# Patient Record
Sex: Female | Born: 1975 | Race: Black or African American | Hispanic: No | Marital: Single | Smoking: Former smoker
Health system: Southern US, Community
[De-identification: ages and names within clinical notes are randomized; demographics above are authoritative.]

## PROBLEM LIST (undated history)

## (undated) DIAGNOSIS — D649 Anemia, unspecified: Secondary | ICD-10-CM

---

## 2004-05-17 ENCOUNTER — Emergency Department: Payer: Self-pay | Admitting: General Practice

## 2005-07-06 ENCOUNTER — Emergency Department: Payer: Self-pay | Admitting: Emergency Medicine

## 2005-07-07 ENCOUNTER — Ambulatory Visit: Payer: Self-pay | Admitting: Emergency Medicine

## 2007-05-05 ENCOUNTER — Emergency Department: Payer: Self-pay | Admitting: Emergency Medicine

## 2008-04-12 ENCOUNTER — Emergency Department: Payer: Self-pay | Admitting: Emergency Medicine

## 2008-12-23 ENCOUNTER — Emergency Department: Payer: Self-pay | Admitting: Internal Medicine

## 2012-02-12 ENCOUNTER — Emergency Department: Payer: Self-pay | Admitting: Emergency Medicine

## 2015-02-25 ENCOUNTER — Encounter: Payer: Self-pay | Admitting: Emergency Medicine

## 2015-02-25 ENCOUNTER — Emergency Department
Admission: EM | Admit: 2015-02-25 | Discharge: 2015-02-25 | Disposition: A | Discharge status: Home / Self Care | Payer: No Typology Code available for payment source | Attending: Emergency Medicine | Admitting: Emergency Medicine

## 2015-02-25 DIAGNOSIS — Y9389 Activity, other specified: Secondary | ICD-10-CM | POA: Insufficient documentation

## 2015-02-25 DIAGNOSIS — Y92410 Unspecified street and highway as the place of occurrence of the external cause: Secondary | ICD-10-CM | POA: Diagnosis not present

## 2015-02-25 DIAGNOSIS — Z880 Allergy status to penicillin: Secondary | ICD-10-CM | POA: Diagnosis not present

## 2015-02-25 DIAGNOSIS — S161XXA Strain of muscle, fascia and tendon at neck level, initial encounter: Secondary | ICD-10-CM | POA: Insufficient documentation

## 2015-02-25 DIAGNOSIS — Y998 Other external cause status: Secondary | ICD-10-CM | POA: Insufficient documentation

## 2015-02-25 DIAGNOSIS — Z87891 Personal history of nicotine dependence: Secondary | ICD-10-CM | POA: Diagnosis not present

## 2015-02-25 DIAGNOSIS — S29002A Unspecified injury of muscle and tendon of back wall of thorax, initial encounter: Secondary | ICD-10-CM | POA: Insufficient documentation

## 2015-02-25 DIAGNOSIS — S199XXA Unspecified injury of neck, initial encounter: Secondary | ICD-10-CM | POA: Diagnosis present

## 2015-02-25 MED ORDER — CYCLOBENZAPRINE HCL 5 MG PO TABS: 5.0000 mg | ORAL_TABLET | Freq: Three times a day (TID) | ORAL | Status: DC | PRN
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
## 2017-05-07 ENCOUNTER — Encounter: Payer: Self-pay | Admitting: Emergency Medicine

## 2017-05-07 ENCOUNTER — Emergency Department
Admission: EM | Admit: 2017-05-07 | Discharge: 2017-05-07 | Disposition: A | Discharge status: Home / Self Care | Payer: Medicaid Other | Attending: Emergency Medicine | Admitting: Emergency Medicine

## 2017-05-07 ENCOUNTER — Other Ambulatory Visit: Payer: Self-pay

## 2017-05-07 DIAGNOSIS — R05 Cough: Secondary | ICD-10-CM

## 2017-05-07 DIAGNOSIS — B349 Viral infection, unspecified: Secondary | ICD-10-CM | POA: Diagnosis not present

## 2017-05-07 DIAGNOSIS — R070 Pain in throat: Secondary | ICD-10-CM | POA: Diagnosis not present

## 2017-05-07 DIAGNOSIS — R0981 Nasal congestion: Secondary | ICD-10-CM | POA: Insufficient documentation

## 2017-05-07 DIAGNOSIS — Z87891 Personal history of nicotine dependence: Secondary | ICD-10-CM | POA: Insufficient documentation

## 2017-05-07 DIAGNOSIS — R059 Cough, unspecified: Secondary | ICD-10-CM

## 2017-05-07 LAB — INFLUENZA PANEL BY PCR (TYPE A & B)
INFLAPCR: NEGATIVE
Influenza B By PCR: NEGATIVE

## 2017-05-07 MED ORDER — HYDROCOD POLST-CPM POLST ER 10-8 MG/5ML PO SUER
5.0000 mL | Freq: Once | ORAL | Status: AC
  Administered 2017-05-07: 5 mL via ORAL
  Filled 2017-05-07: qty 5

## 2017-05-07 MED ORDER — HYDROCOD POLST-CPM POLST ER 10-8 MG/5ML PO SUER: 5.0000 mL | Freq: Two times a day (BID) | ORAL | 0 refills | Status: DC

## 2017-05-07 NOTE — ED Notes (Signed)
Patient reports symptoms for 2 weeks.  States cold and cough thought it had gotten better but the cough returned and was told by work to get checked out.

## 2017-05-07 NOTE — ED Triage Notes (Signed)
Pt arrives ambulatory to triage with c/o flu like symptoms. Pt reports having symptoms x 2 weeks. Pt is in NAD.

## 2017-05-07 NOTE — Discharge Instructions (Signed)
1.  You may take Tussionex as needed for cough. 2.  Return to the ER for worsening symptoms, persistent vomiting, difficulty breathing or other concerns.

## 2017-05-07 NOTE — ED Notes (Signed)
ED Provider at bedside. 

## 2017-05-07 NOTE — ED Provider Notes (Signed)
[facility] Emergency Department Provider Note   ____________________________________________   First MD Initiated Contact with Patient 05/07/17 0423     (approximate)  I have reviewed the triage vital signs and the nursing notes.   HISTORY  Chief Complaint Influenza    HPI Julie Tanner is a 42 y.o. female who presents to the ED from home with a chief complaint of cold-like symptoms.  Patient reports symptoms for the past 2 weeks.  Endorses dry cough, nasal congestion, sore throat.  Denies associated fever, chills, chest pain, abdominal pain, nausea, vomiting, diarrhea.  Denies recent travel or trauma.   Past medical history None  There are no active problems to display for this patient.   History reviewed. No pertinent surgical history.  Prior to Admission medications   Medication Sig Start Date End Date Taking? Authorizing Provider  chlorpheniramine-HYDROcodone (TUSSIONEX PENNKINETIC ER) 10-8 MG/5ML SUER Take 5 mLs by mouth 2 (two) times daily. 05/07/17   Paulette Blanch, MD  cyclobenzaprine (FLEXERIL) 5 MG tablet Take 1 tablet (5 mg total) by mouth 3 (three) times daily as needed for muscle spasms. 02/25/15   Menshew, Dannielle Karvonen, PA-C    Allergies Penicillins  No family history on file.  Social History Social History   Tobacco Use  . Smoking status: Former Research scientist (life sciences)  . Smokeless tobacco: Never Used  Substance Use Topics  . Alcohol use: No  . Drug use: No    Review of Systems  Constitutional: No fever/chills. Eyes: No visual changes. ENT: Positive for nasal congestion and sore throat. Cardiovascular: Denies chest pain. Respiratory: Positive for dry cough.  Denies shortness of breath. Gastrointestinal: No abdominal pain.  No nausea, no vomiting.  No diarrhea.  No constipation. Genitourinary: Negative for dysuria. Musculoskeletal: Negative for back pain. Skin: Negative for rash. Neurological: Negative for headaches, focal  weakness or numbness.   ____________________________________________   PHYSICAL EXAM:  VITAL SIGNS: ED Triage Vitals  Enc Vitals Group     BP 05/07/17 0047 134/86     Pulse Rate 05/07/17 0047 89     Resp 05/07/17 0047 19     Temp 05/07/17 0047 98.3 F (36.8 C)     Temp Source 05/07/17 0047 Oral     SpO2 05/07/17 0047 100 %     Weight 05/07/17 0047 220 lb (99.8 kg)     Height 05/07/17 0047 5\' 3"  (1.6 m)     Head Circumference --      Peak Flow --      Pain Score 05/07/17 0057 8     Pain Loc --      Pain Edu? --      Excl. in [place]? --     Constitutional: Alert and oriented. Well appearing and in no acute distress. Eyes: Conjunctivae are normal. PERRL. EOMI. Head: Atraumatic. Nose: Congestion/rhinnorhea. Mouth/Throat: Mucous membranes are moist.  Oropharynx slightly erythematous without tonsillar swelling, exudates or peritonsillar abscess.  Hoarse voice.  There is no muffled voice or drooling. Neck: No stridor.   Hematological/Lymphatic/Immunilogical: No cervical lymphadenopathy. Cardiovascular: Normal rate, regular rhythm. Grossly normal heart sounds.  Good peripheral circulation. Respiratory: Normal respiratory effort.  No retractions. Lungs CTAB. Gastrointestinal: Soft and nontender. No distention. No abdominal bruits. No CVA tenderness. Musculoskeletal: No lower extremity tenderness nor edema.  No joint effusions. Neurologic:  Normal speech and language. No gross focal neurologic deficits are appreciated. No gait instability. Skin:  Skin is warm, dry and intact. No rash noted.  No petechiae. Psychiatric:  Mood and affect are normal. Speech and behavior are normal.  ____________________________________________   LABS (all labs ordered are listed, but only abnormal results are displayed)  Labs Reviewed  INFLUENZA PANEL BY PCR (TYPE A & B)   ____________________________________________  EKG  None ____________________________________________  RADIOLOGY  ED MD  interpretation: None  Official radiology report(s): No results found.  ____________________________________________   PROCEDURES  Procedure(s) performed: None  Procedures  Critical Care performed: No  ____________________________________________   INITIAL IMPRESSION / ASSESSMENT AND PLAN / ED COURSE  As part of my medical decision making, I reviewed the following data within the [place] notes reviewed and incorporated, Labs reviewed and Notes from prior ED visits.   42 year old female who presents with viral bronchitis.  Will administer Tussionex.  Strict return precautions given.  Patient verbalize understanding and agrees with plan of care.      ____________________________________________   FINAL CLINICAL IMPRESSION(S) / ED DIAGNOSES  Final diagnoses:  Viral illness  Cough     ED Discharge Orders        Ordered    chlorpheniramine-HYDROcodone (TUSSIONEX PENNKINETIC ER) 10-8 MG/5ML SUER  2 times daily     05/07/17 0439       Note:  This document was prepared using Dragon voice recognition software and may include unintentional dictation errors.    Paulette Blanch, MD 05/07/17 [id number]

## 2018-05-24 ENCOUNTER — Emergency Department: Payer: Self-pay

## 2018-05-24 ENCOUNTER — Other Ambulatory Visit: Payer: Self-pay

## 2018-05-24 ENCOUNTER — Encounter: Payer: Self-pay | Admitting: Emergency Medicine

## 2018-05-24 ENCOUNTER — Emergency Department
Admission: EM | Admit: 2018-05-24 | Discharge: 2018-05-25 | Disposition: A | Discharge status: Home / Self Care | Payer: Self-pay | Attending: Emergency Medicine | Admitting: Emergency Medicine

## 2018-05-24 DIAGNOSIS — H5462 Unqualified visual loss, left eye, normal vision right eye: Secondary | ICD-10-CM

## 2018-05-24 DIAGNOSIS — Z79899 Other long term (current) drug therapy: Secondary | ICD-10-CM | POA: Insufficient documentation

## 2018-05-24 DIAGNOSIS — H53132 Sudden visual loss, left eye: Secondary | ICD-10-CM | POA: Insufficient documentation

## 2018-05-24 DIAGNOSIS — Z87891 Personal history of nicotine dependence: Secondary | ICD-10-CM | POA: Insufficient documentation

## 2018-05-24 LAB — COMPREHENSIVE METABOLIC PANEL
ALBUMIN: 3.6 g/dL (ref 3.5–5.0)
ALT: 10 U/L (ref 0–44)
AST: 13 U/L — AB (ref 15–41)
Alkaline Phosphatase: 53 U/L (ref 38–126)
Anion gap: 6 (ref 5–15)
BUN: 8 mg/dL (ref 6–20)
CHLORIDE: 106 mmol/L (ref 98–111)
CO2: 27 mmol/L (ref 22–32)
CREATININE: 0.55 mg/dL (ref 0.44–1.00)
Calcium: 8.4 mg/dL — ABNORMAL LOW (ref 8.9–10.3)
GFR calc non Af Amer: >60 mL/min (ref >60)
GLUCOSE: 106 mg/dL — AB (ref 70–99)
Potassium: 3.2 mmol/L — ABNORMAL LOW (ref 3.5–5.1)
SODIUM: 139 mmol/L (ref 135–145)
Total Bilirubin: 0.6 mg/dL (ref 0.3–1.2)
Total Protein: 7.5 g/dL (ref 6.5–8.1)

## 2018-05-24 LAB — CBC
HCT: 25.3 % — ABNORMAL LOW (ref 36.0–46.0)
HEMOGLOBIN: 6.7 g/dL — AB (ref 12.0–15.0)
MCH: 16.2 pg — AB (ref 26.0–34.0)
MCHC: 26.5 g/dL — ABNORMAL LOW (ref 30.0–36.0)
MCV: 61.3 fL — ABNORMAL LOW (ref 80.0–100.0)
NRBC: 0 % (ref 0.0–0.2)
PLATELETS: 558 10*3/uL — AB (ref 150–400)
RBC: 4.13 MIL/uL (ref 3.87–5.11)
RDW: 20.2 % — ABNORMAL HIGH (ref 11.5–15.5)
WBC: 6.5 10*3/uL (ref 4.0–10.5)

## 2018-05-24 LAB — POCT PREGNANCY, URINE: Preg Test, Ur: NEGATIVE

## 2018-05-24 MED ORDER — IOHEXOL 350 MG/ML SOLN
75.0000 mL | Freq: Once | INTRAVENOUS | Status: AC | PRN
  Administered 2018-05-24: 75 mL via INTRAVENOUS

## 2018-05-24 NOTE — ED Triage Notes (Signed)
Says she has lost vision in left eye 3 times today for few minutes each.  Last around 1050am.  Says the entire field of vision is gone when it happens.

## 2018-05-24 NOTE — Discharge Instructions (Signed)
Follow-up with Dr. George Ina at [facility] eye tomorrow.  You should call first thing in the morning and let them know that you were seen in the emergency department and were instructed to follow-up today.  Return to the ER immediately for new, worsening, or persistent vision changes, headache, weakness or numbness, or any other new or worsening symptoms that concern you.

## 2018-05-24 NOTE — ED Provider Notes (Signed)
[facility] Emergency Department Provider Note ____________________________________________   First MD Initiated Contact with Patient 05/24/18 1946     (approximate)  I have reviewed the triage vital signs and the nursing notes.   HISTORY  Chief Complaint Loss of Vision    HPI Julie Tanner is a 43 y.o. female with a history of anemia who presents with vision loss to her left eye, acute onset earlier this morning and occurring 3 times.  She states that the third time was the longest, lasting about 9 minutes, and ending just before 11 AM.  She states that when the vision came back it appeared snowy but this resolved after a short time and she has had no other vision disturbances.  She had no associated pain or any headache.  No prior history of symptoms like this.  She has no numbness or weakness in the rest of her body.  She denies chest pain, shortness of breath, or generalized weakness.  History reviewed. No pertinent past medical history.  There are no active problems to display for this patient.   History reviewed. No pertinent surgical history.  Prior to Admission medications   Medication Sig Start Date End Date Taking? Authorizing Provider  chlorpheniramine-HYDROcodone (TUSSIONEX PENNKINETIC ER) 10-8 MG/5ML SUER Take 5 mLs by mouth 2 (two) times daily. 05/07/17   Paulette Blanch, MD  cyclobenzaprine (FLEXERIL) 5 MG tablet Take 1 tablet (5 mg total) by mouth 3 (three) times daily as needed for muscle spasms. 02/25/15   Menshew, Dannielle Karvonen, PA-C    Allergies Penicillins  No family history on file.  Social History Social History   Tobacco Use  . Smoking status: Former Research scientist (life sciences)  . Smokeless tobacco: Never Used  Substance Use Topics  . Alcohol use: No  . Drug use: No    Review of Systems  Constitutional: No fever. Eyes: Positive for resolved vision loss. ENT: No sore throat. Cardiovascular: Denies chest pain. Respiratory: Denies  shortness of breath. Gastrointestinal: No vomiting or diarrhea.  Genitourinary: Negative for dysuria.  Musculoskeletal: Negative for back pain. Skin: Negative for rash. Neurological: Negative for headache.   ____________________________________________   PHYSICAL EXAM:  VITAL SIGNS: ED Triage Vitals  Enc Vitals Group     BP 05/24/18 1504 (!) 144/92     Pulse Rate 05/24/18 1504 86     Resp 05/24/18 1504 14     Temp 05/24/18 1504 98.4 F (36.9 C)     Temp Source 05/24/18 1504 Oral     SpO2 05/24/18 1504 100 %     Weight 05/24/18 1454 212 lb (96.2 kg)     Height 05/24/18 1454 5\' 3"  (1.6 m)     Head Circumference --      Peak Flow --      Pain Score 05/24/18 1454 6     Pain Loc --      Pain Edu? --      Excl. in [place]? --     Constitutional: Alert and oriented.  Relatively well appearing and in no acute distress. Eyes: Conjunctivae are normal.  EOMI.  PERRLA. Head: Atraumatic. Nose: No congestion/rhinnorhea. Mouth/Throat: Mucous membranes are moist.   Neck: Normal range of motion.  Cardiovascular: Good peripheral circulation. Respiratory: Normal respiratory effort.  No retractions.  Gastrointestinal: No distention.  Musculoskeletal:  Extremities warm and well perfused.  Neurologic:  Normal speech and language.  Cranial nerves III through XII intact.  Motor and sensory intact in all extremities.  Normal coordination  with no ataxia on finger-to-nose.  No gross focal neurologic deficits are appreciated.  Skin:  Skin is warm and dry. No rash noted. Psychiatric: Mood and affect are normal. Speech and behavior are normal.  ____________________________________________   LABS (all labs ordered are listed, but only abnormal results are displayed)  Labs Reviewed  CBC - Abnormal; Notable for the following components:      Result Value   Hemoglobin 6.7 (*)    HCT 25.3 (*)    MCV 61.3 (*)    MCH 16.2 (*)    MCHC 26.5 (*)    RDW 20.2 (*)    Platelets 558 (*)    All other  components within normal limits  COMPREHENSIVE METABOLIC PANEL - Abnormal; Notable for the following components:   Potassium 3.2 (*)    Glucose, Bld 106 (*)    Calcium 8.4 (*)    AST 13 (*)    All other components within normal limits   ____________________________________________  EKG   ____________________________________________  RADIOLOGY  CT head: No acute abnormalities MR brain: Pending  ____________________________________________   PROCEDURES  Procedure(s) performed: No  Procedures  Critical Care performed: No ____________________________________________   INITIAL IMPRESSION / ASSESSMENT AND PLAN / ED COURSE  Pertinent labs & imaging results that were available during my care of the patient were reviewed by me and considered in my medical decision making (see chart for details).  43 year old female with history of anemia presents with 3 episodes of vision loss in her left eye, described as the vision going completely black and resolving after a few minutes.  She has no prior history of this and has no associated symptoms.  On exam currently, the patient is well-appearing and her vital signs are normal except for hypertension.  Neurologic exam is nonfocal.  She denies any acute vision deficits at this time.  Overall the presentation is most consistent with amaurosis fugax versus some type of atypical migraine.  CT head obtained from triage is negative.  I will obtain an MRI and discuss with ophthalmology.  ----------------------------------------- 8:47 PM on 05/24/2018 -----------------------------------------  I discussed the case with Dr. George Ina from ophthalmology over the phone.  He agreed with the current plan of care and stated that the patient could follow-up with him tomorrow.  She is pending an MRI at this time.  Lab work-up reveals relatively significant anemia, with a hemoglobin of 6.7.  I was able to review past labs in care everywhere and the  patient has a history of chronic anemia running as low as the fives.  The current reading is actually close to her baseline.  She has no symptoms of anemia at this time.  Therefore, there is no indication for emergent transfusion or work-up.  If the MRI is negative, we will plan for discharge home with ophthalmology follow-up tomorrow.  The patient agrees with this plan.  I am signing the patient out to the oncoming physician Dr. Alfred Levins. ____________________________________________   FINAL CLINICAL IMPRESSION(S) / ED DIAGNOSES  Final diagnoses:  Vision loss of left eye      NEW MEDICATIONS STARTED DURING THIS VISIT:  New Prescriptions   No medications on file     Note:  This document was prepared using Dragon voice recognition software and may include unintentional dictation errors.    Arta Silence, MD 05/24/18 2049

## 2018-05-24 NOTE — ED Notes (Signed)
Sent green and purple tubes to lab. 

## 2018-05-24 NOTE — ED Notes (Signed)
Patient transported to CT 

## 2018-05-24 NOTE — ED Notes (Addendum)
Pt denies having lost vision since waiting in the lobby. Last episode of left sided vision loss lasted 9 minutes and pt reports when her vision started to return it came back partially first and then completely. Pt denies left sided weakness or deficits Pt was around daughter and coworkers who denied obvious changes in pts face. Nausea throughout episodes without vomiting. Pt denies hx of HTN but reports when BP was checked at work it was elevated. NO lightheadedness or dizziness and pt denies ever feeling as though she was going to pass out. Pt is calm, alert and oriented in treatment bed at this time. No neuro deficits noted. NO vision changes at this time. PERRLA

## 2018-05-24 NOTE — ED Notes (Signed)
MD at bedside. 

## 2018-05-25 LAB — PREGNANCY, URINE: Preg Test, Ur: NEGATIVE

## 2018-05-25 NOTE — ED Provider Notes (Signed)
I assumed care of the patient from Dr. Rae Lips at 11:30 PM with recommendation to follow-up on CT angiogram of the neck and if negative patient cleared to discharge home with follow-up with Dr. George Ina today.  CT angiogram revealed no acute abnormality.  Spoke with the patient who has no visual disturbances at present.  Patient be discharged home as recommended.   Gregor Hams, MD 05/25/18 [phone]

## 2019-01-17 ENCOUNTER — Other Ambulatory Visit: Payer: Self-pay

## 2019-01-17 DIAGNOSIS — Z20822 Contact with and (suspected) exposure to covid-19: Secondary | ICD-10-CM

## 2019-01-18 LAB — NOVEL CORONAVIRUS, NAA: SARS-CoV-2, NAA: NOT DETECTED

## 2019-02-15 ENCOUNTER — Other Ambulatory Visit: Payer: Self-pay

## 2019-02-15 DIAGNOSIS — Z20822 Contact with and (suspected) exposure to covid-19: Secondary | ICD-10-CM

## 2019-02-18 LAB — NOVEL CORONAVIRUS, NAA: SARS-CoV-2, NAA: NOT DETECTED

## 2020-04-20 IMAGING — MR MRI HEAD WITHOUT CONTRAST
11 series · 42 of 48 positions shown · non-contrast
Comparison: Prior CT earlier the same day.

CLINICAL DATA: Initial evaluation for amaurosis fugax, intermittent
left visual loss.

EXAM:
MRI HEAD WITHOUT CONTRAST
TECHNIQUE: Multiplanar, multiecho pulse sequences of the brain and surrounding
structures were obtained without intravenous contrast.

[Series 5: ax dwi_tracew · axial · 3.0mm · 0.60mm/px · z∈[-59,+103]mm · 4 of 55 slices shown]
[im 1/55]
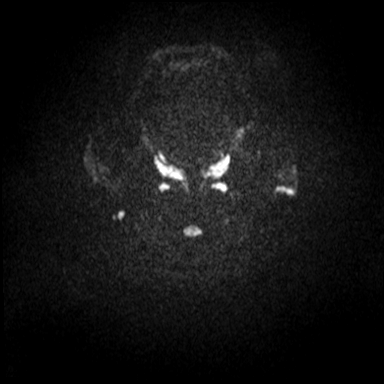
[im 19/55]
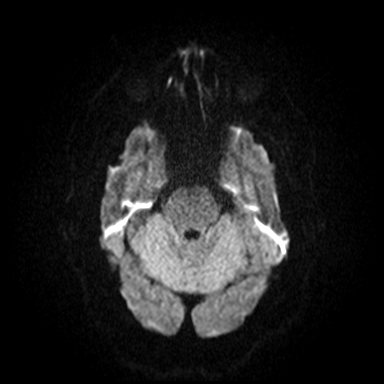
[im 37/55]
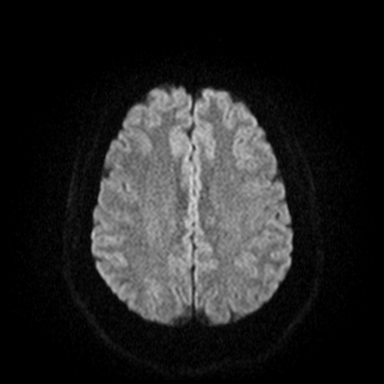
[im 55/55]
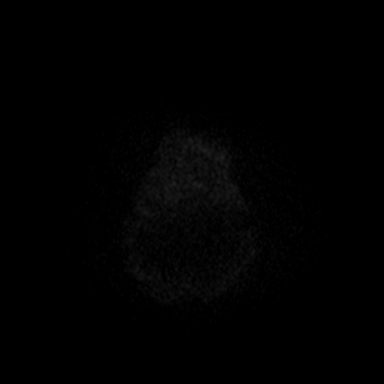

[Series 6: ax dwi_adc · axial · 3.0mm · 0.60mm/px · z∈[-59,+103]mm · 4 of 55 slices shown]
[im 1/55]
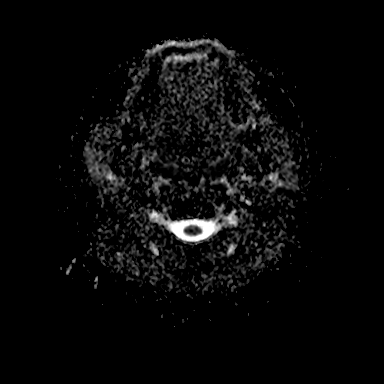
[im 19/55]
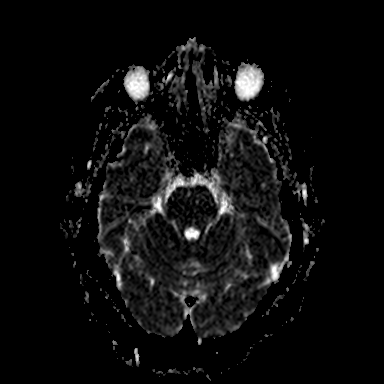
[im 37/55]
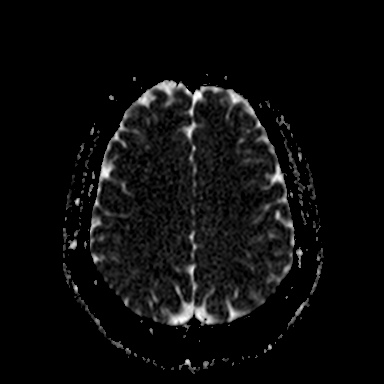
[im 55/55]
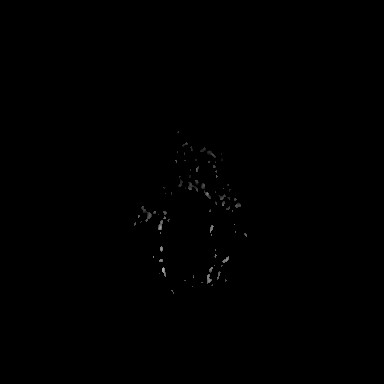

[Series 7: cor dwi_tracew · coronal · 5.0mm · 0.60mm/px · 3 of 39 slices shown]
[im 1/39]
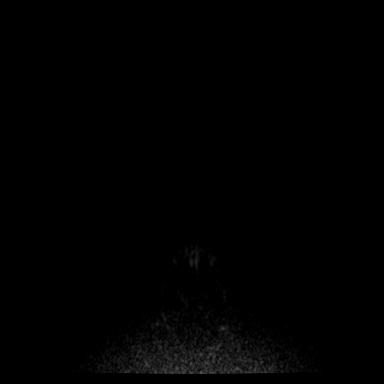
[im 20/39]
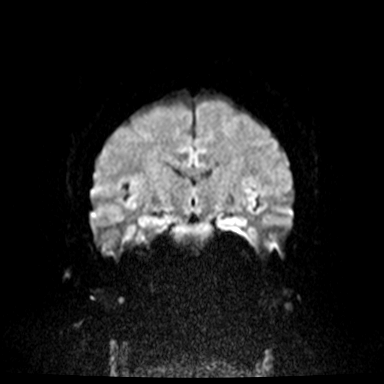
[im 39/39]
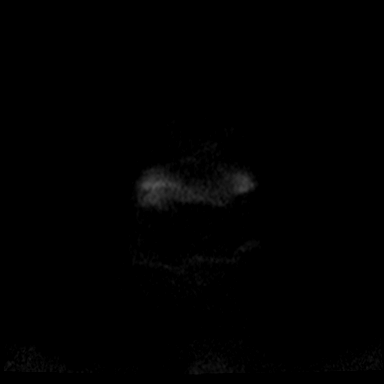

[Series 8: cor dwi_adc · coronal · 5.0mm · 0.60mm/px · 3 of 39 slices shown]
[im 1/39]
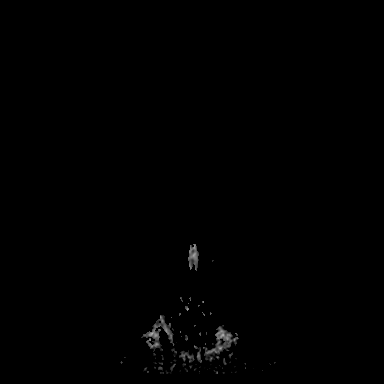
[im 20/39]
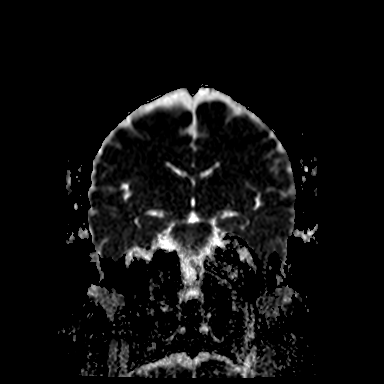
[im 39/39]
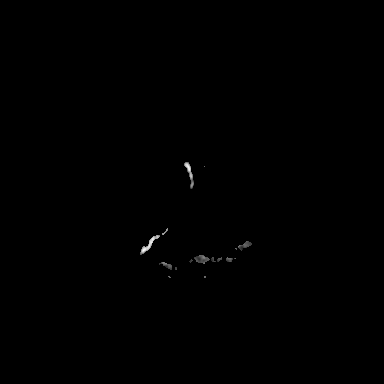

[Series 9: T1 · sagittal · 5.0mm · 0.62mm/px · 2 of 23 slices shown (1 of 2)]
[im 1/23]
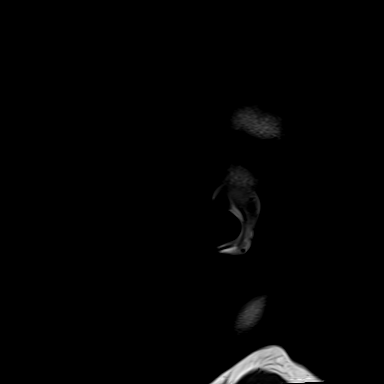
[im 23/23]
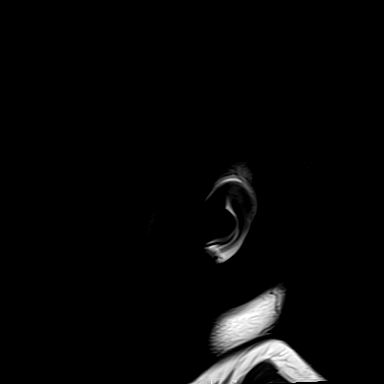

[Series 10: T2 · axial · 5.0mm · 0.53mm/px · z∈[-55,+101]mm · 2 of 27 slices shown (1 of 2)]
[im 1/27]
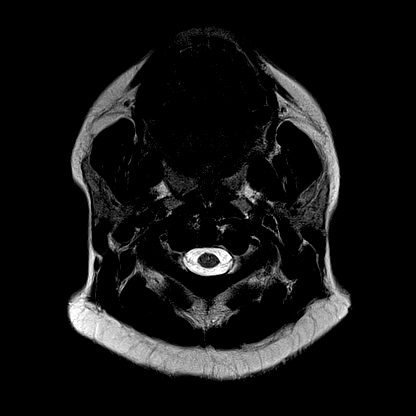
[im 27/27]
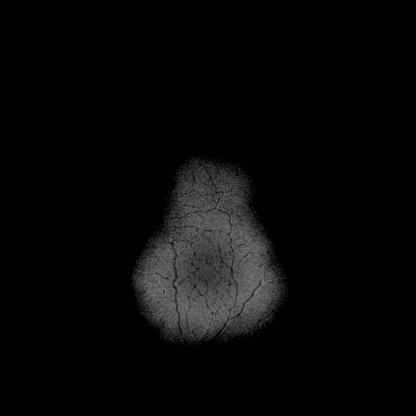

[Series 12: pha_images · axial · 3.0mm · 0.90mm/px · z∈[-66,+111]mm · 5 of 60 slices shown]
[im 1/60]
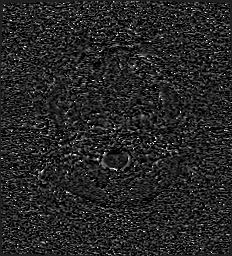
[im 15/60]
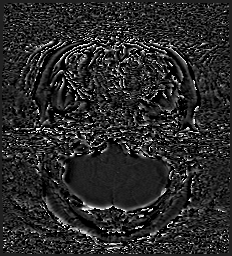
[im 30/60]
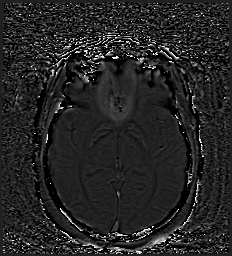
[im 45/60]
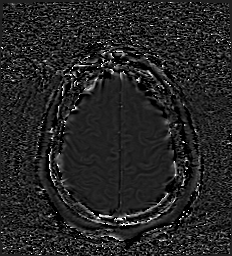
[im 60/60]
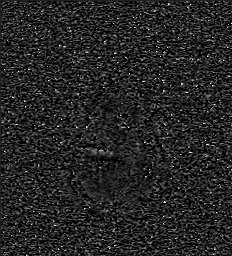

[Series 13: swi_images · axial · 3.0mm · 0.90mm/px · z∈[-66,+111]mm · 5 of 60 slices shown]
[im 1/60]
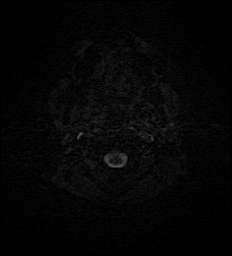
[im 15/60]
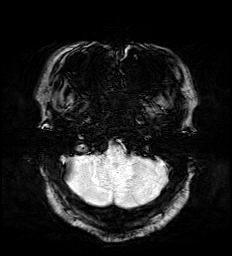
[im 30/60]
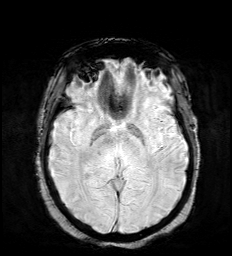
[im 45/60]
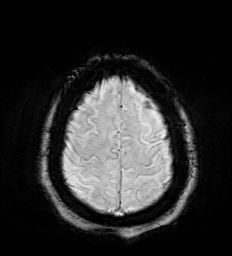
[im 60/60]
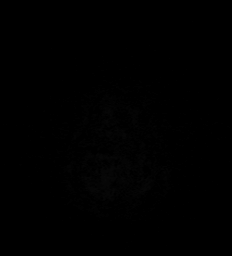

[Series 15: FLAIR · axial · 3.0mm · 0.53mm/px · z∈[-58,+104]mm · 4 of 55 slices shown]
[im 1/55]
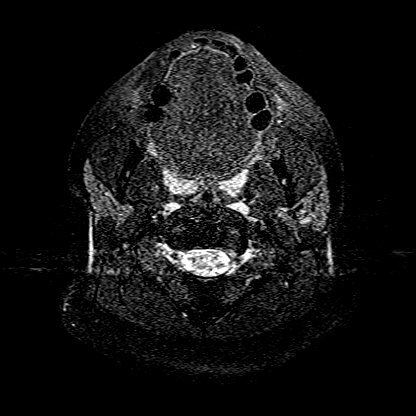
[im 19/55]
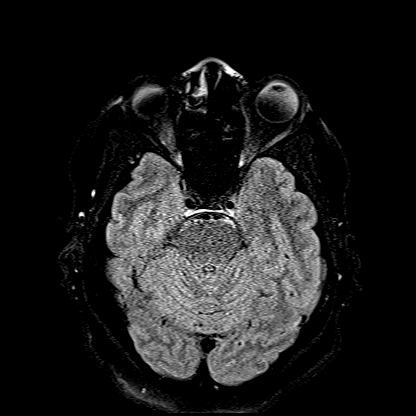
[im 37/55]
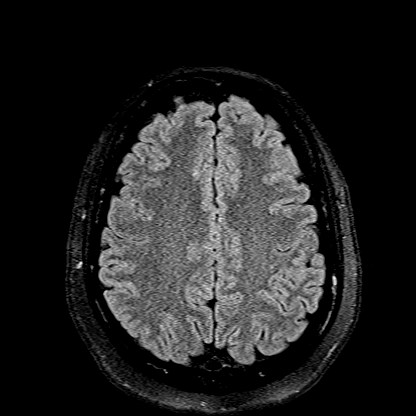
[im 55/55]
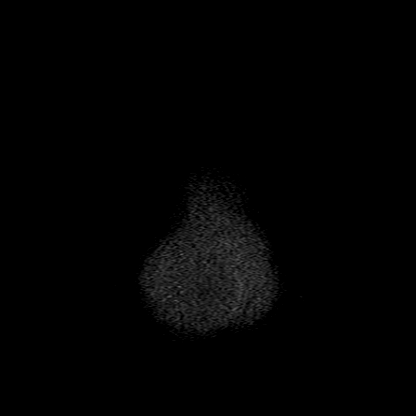

[Series 16: T1 · axial · 1.0mm · 0.98mm/px · z∈[-58,+117]mm · 8 of 176 slices shown (2 of 2)]
[im 1/176]
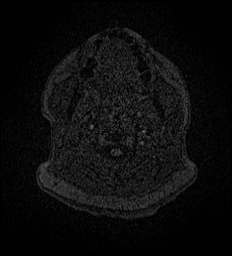
[im 27/176]
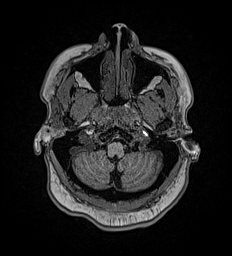
[im 54/176]
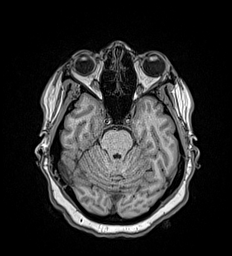
[im 81/176]
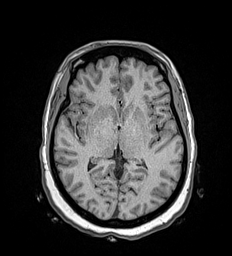
[im 95/176]
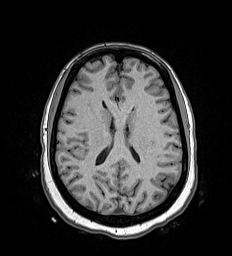
[im 122/176]
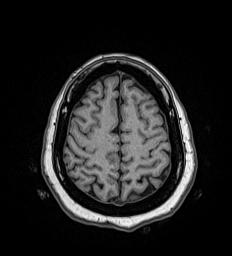
[im 149/176]
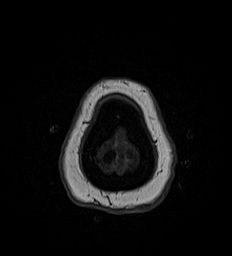
[im 176/176]
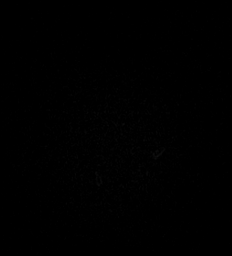

[Series 17: T2 · coronal · 5.0mm · 0.57mm/px · 2 of 29 slices shown (2 of 2)]
[im 1/29]
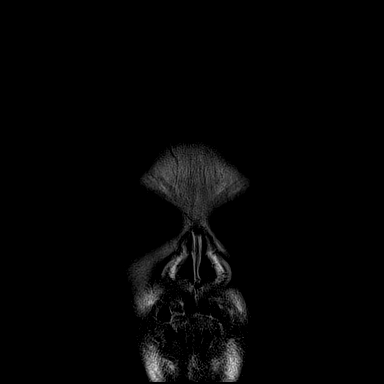
[im 29/29]
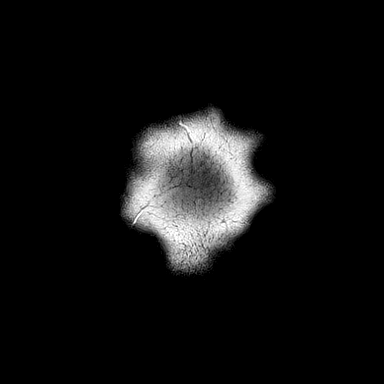

[42 of 48 positions shown; findings below may reference images not displayed]

FINDINGS: Brain: Cerebral volume within normal limits for patient age. No
focal parenchymal signal abnormality identified.

No abnormal foci of restricted diffusion to suggest acute or
subacute ischemia. Gray-white matter differentiation well
maintained. No encephalomalacia to suggest chronic infarction. No
foci of susceptibility artifact to suggest acute or chronic
intracranial hemorrhage.

No mass lesion, midline shift or mass effect. No hydrocephalus. No
extra-axial fluid collection. Major dural sinuses are grossly
patent.

Pituitary gland and suprasellar region are normal. Midline
structures intact and normal.

Vascular: Major intracranial vascular flow voids well maintained and
normal in appearance.

Skull and upper cervical spine: Craniocervical junction normal.
Visualized upper cervical spine within normal limits. Bone marrow
signal diffusely decreased on T1 weighted imaging, most likely
related to history of anemia. No focal marrow replacing lesion. No
scalp soft tissue abnormality.

Sinuses/Orbits: Globes and orbital soft tissues within normal
limits.

Paranasal sinuses are clear. No mastoid effusion. Inner ear
structures normal.

Other: None.
IMPRESSION: Negative brain MRI.  No acute intracranial abnormality.

## 2020-04-20 IMAGING — CT CT HEAD WITHOUT CONTRAST
3 series · 16 of 47 positions shown, 19 images · non-contrast
Comparison: February 12, 2012

CLINICAL DATA: Left-sided vision loss

EXAM:
CT HEAD WITHOUT CONTRAST
TECHNIQUE: Contiguous axial images were obtained from the base of the skull
through the vertex without intravenous contrast.

[Series 2: head wo · axial · 0.42mm/px · z∈[-115,+10]mm · 10 of 30 slices shown, 13 images]
[im 3/30  brain]
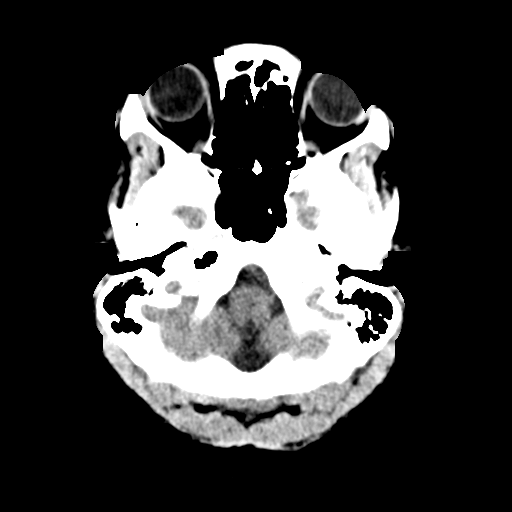
[im 3/30  bone]
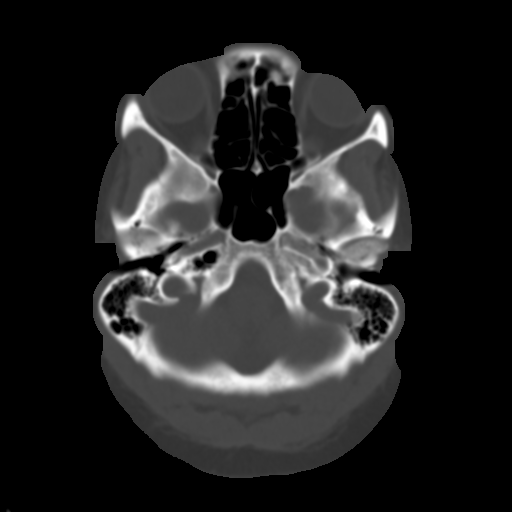
[im 6/30  brain]
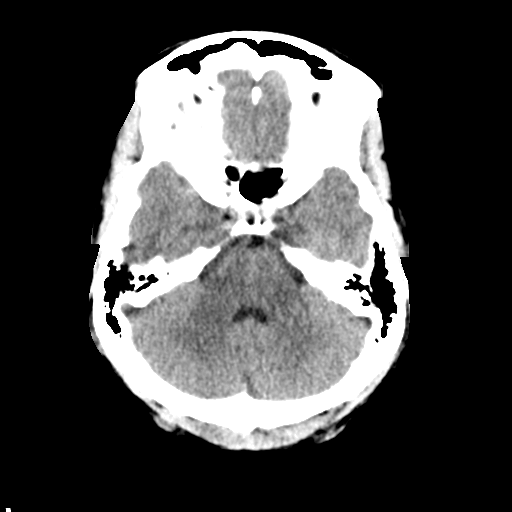
[im 9/30  brain]
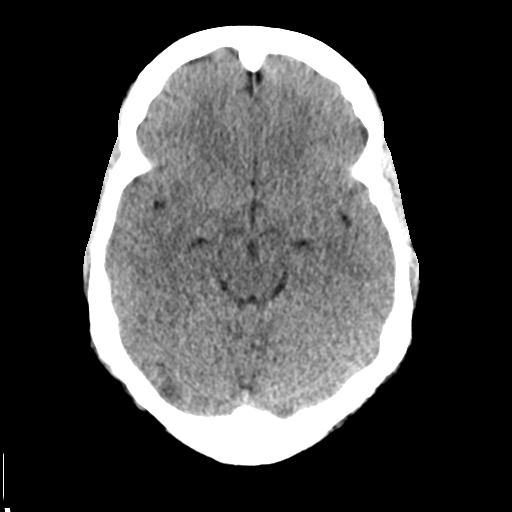
[im 11/30  brain]
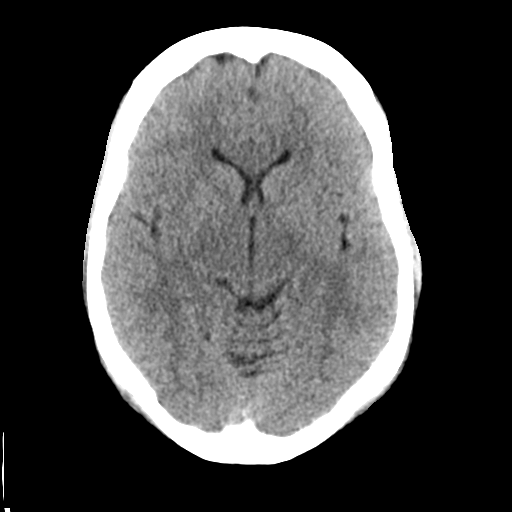
[im 14/30  brain]
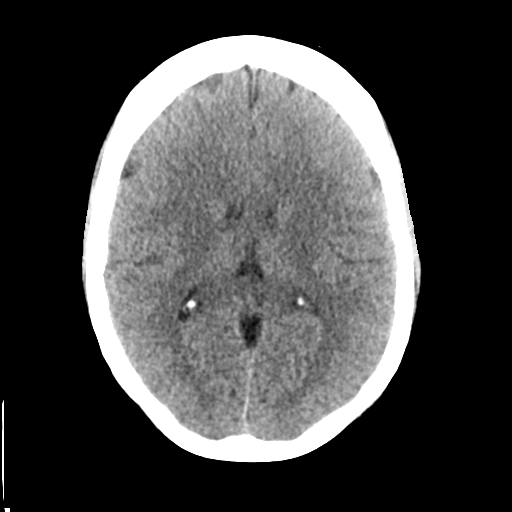
[im 14/30  bone]
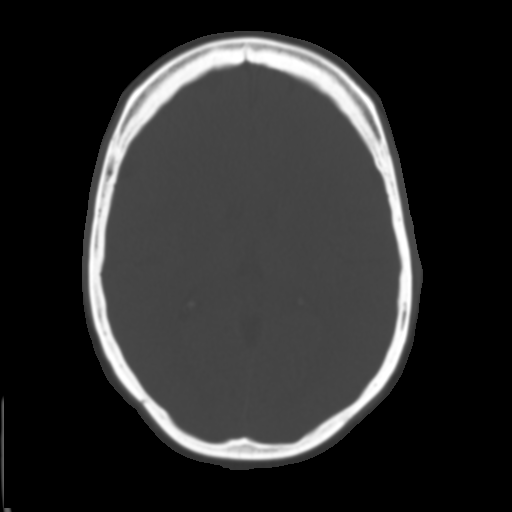
[im 17/30  brain]
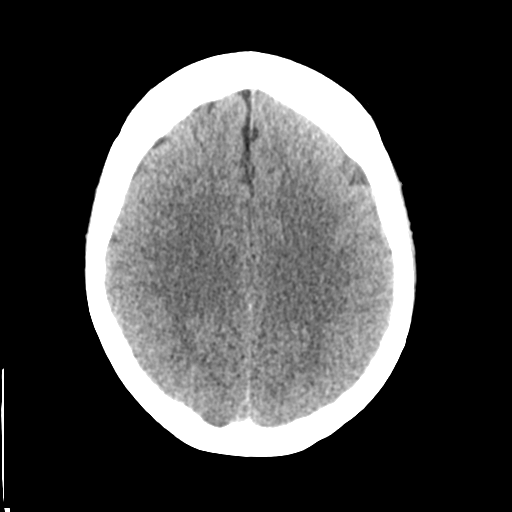
[im 20/30  brain]
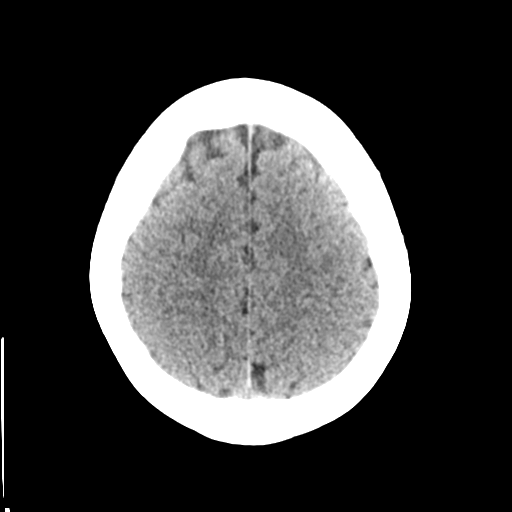
[im 23/30  brain]
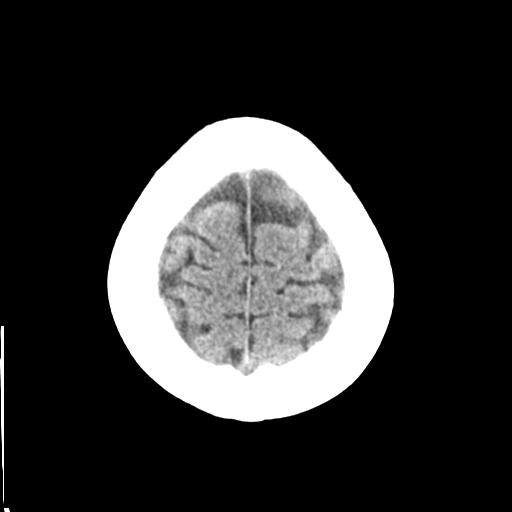
[im 25/30  brain]
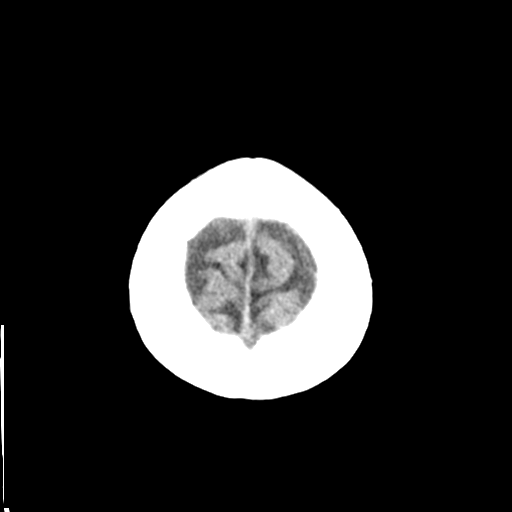
[im 25/30  bone]
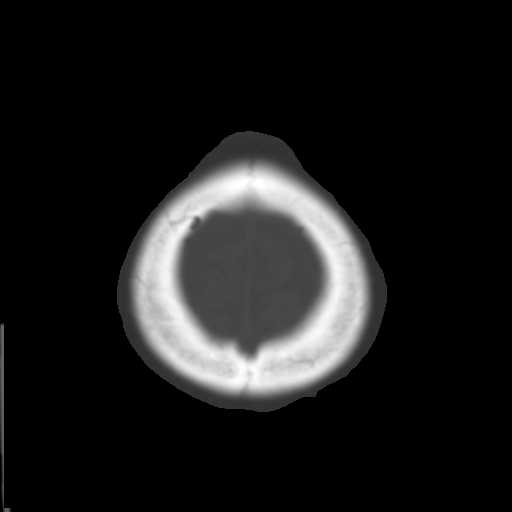
[im 28/30  brain]
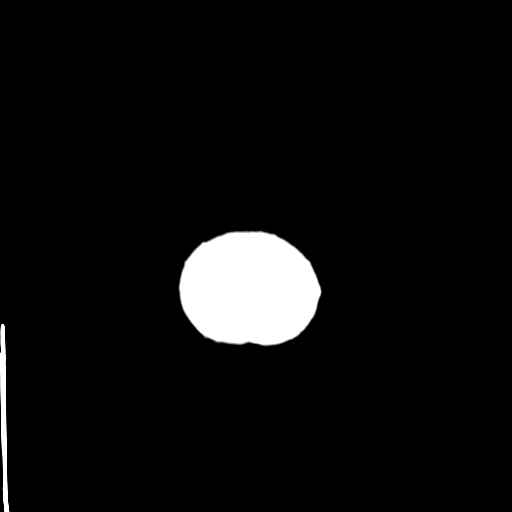

[Series 4: coronal soft tissue · coronal · 0.31mm/px · 3 of 62 slices shown]
[im 21/62  brain]
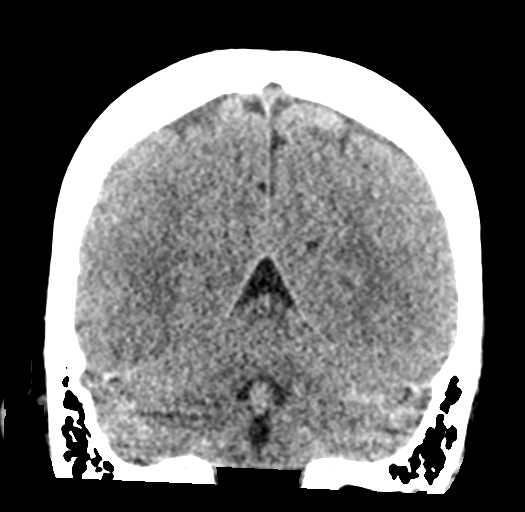
[im 28/62  brain]
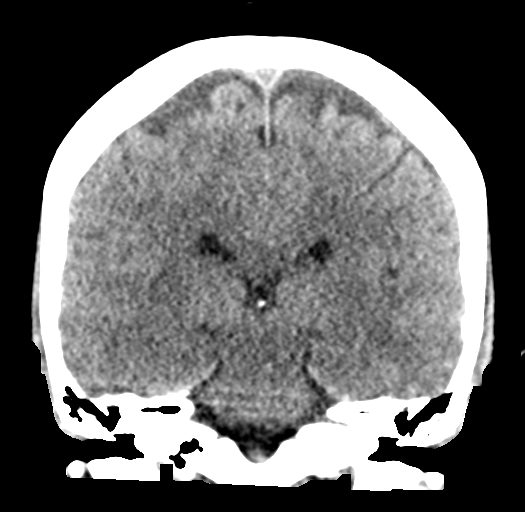
[im 34/62  brain]
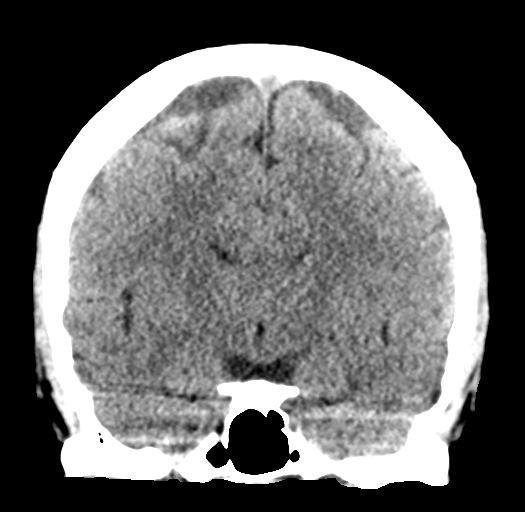

[Series 5: sagittal soft tissue · sagittal · 0.31mm/px · 3 of 50 slices shown]
[im 17/50  brain]
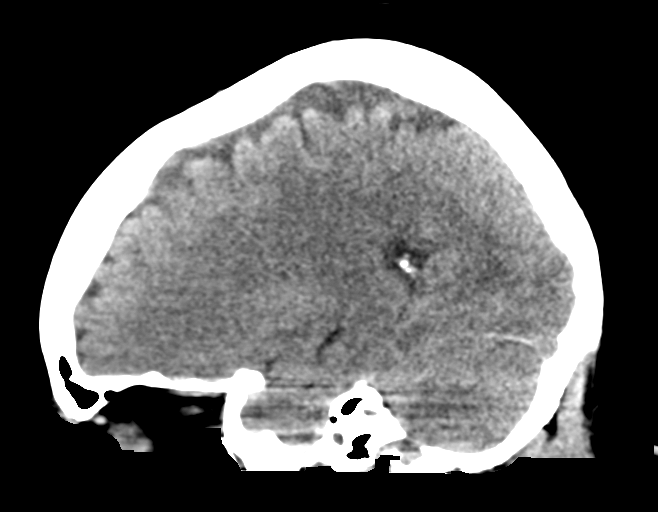
[im 25/50  brain]
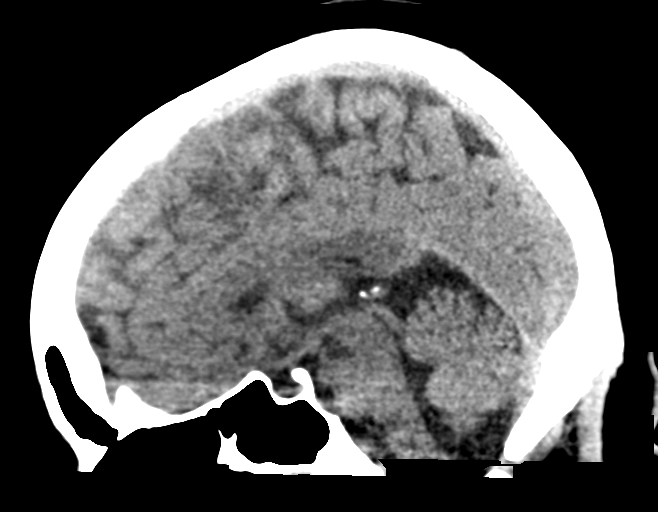
[im 33/50  brain]
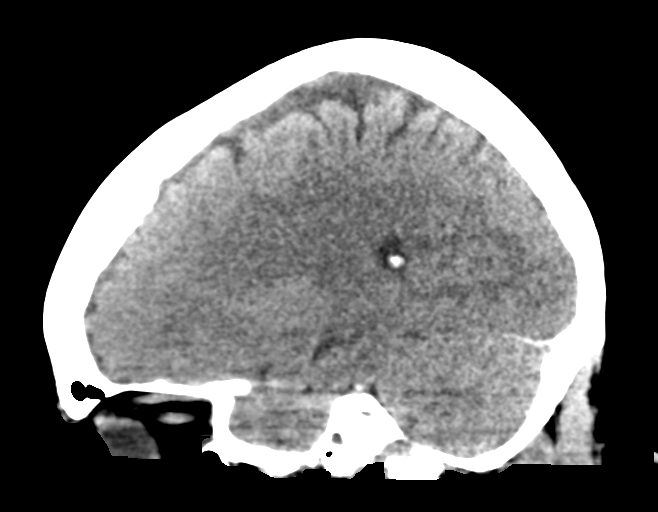

[16 of 47 positions shown; findings below may reference images not displayed]

FINDINGS: Brain: The ventricles are normal in size and configuration. There is
no intracranial mass, hemorrhage, extra-axial fluid collection, or
midline shift. The brain parenchyma appears unremarkable. There is
no evident acute infarct.

Vascular: No hyperdense vessel.  No vascular calcification evident.

Skull: The bony calvarium appears intact.

Sinuses/Orbits: Visualized paranasal sinuses are clear. Visualized
orbits appear symmetric bilaterally.

Other: Visualized mastoid air cells are clear.
IMPRESSION: Study within normal limits.

## 2020-07-21 ENCOUNTER — Other Ambulatory Visit: Payer: Self-pay

## 2020-07-21 ENCOUNTER — Emergency Department
Admission: EM | Admit: 2020-07-21 | Discharge: 2020-07-21 | Disposition: A | Discharge status: Home / Self Care | Payer: Self-pay | Attending: Emergency Medicine | Admitting: Emergency Medicine

## 2020-07-21 DIAGNOSIS — J029 Acute pharyngitis, unspecified: Secondary | ICD-10-CM | POA: Insufficient documentation

## 2020-07-21 DIAGNOSIS — J069 Acute upper respiratory infection, unspecified: Secondary | ICD-10-CM | POA: Insufficient documentation

## 2020-07-21 DIAGNOSIS — Z87891 Personal history of nicotine dependence: Secondary | ICD-10-CM | POA: Insufficient documentation

## 2020-07-21 DIAGNOSIS — H9201 Otalgia, right ear: Secondary | ICD-10-CM | POA: Insufficient documentation

## 2020-07-21 DIAGNOSIS — Z20822 Contact with and (suspected) exposure to covid-19: Secondary | ICD-10-CM | POA: Insufficient documentation

## 2020-07-21 LAB — GROUP A STREP BY PCR: Group A Strep by PCR: NOT DETECTED

## 2020-07-21 MED ORDER — LIDOCAINE VISCOUS HCL 2 % MT SOLN
15.0000 mL | Freq: Once | OROMUCOSAL | Status: AC
  Administered 2020-07-21: 15 mL via OROMUCOSAL
  Filled 2020-07-21: qty 15

## 2020-07-21 MED ORDER — LIDOCAINE VISCOUS HCL 2 % MT SOLN: 15.0000 mL | OROMUCOSAL | 0 refills | Status: DC | PRN

## 2020-07-21 NOTE — ED Triage Notes (Signed)
Pt arrives via pov with c/o rt ear pain, sore throat and nasal congestion starting last Thursday. Denies any fevers, or n/v/d. NAD noted at this time

## 2020-07-21 NOTE — Discharge Instructions (Addendum)
Please use Tylenol and ibuprofen as needed for pain.  You may also gargle and swallow with the viscous lidocaine that was sent to your pharmacy.  I will call you if your strep results are positive in which case I will also call in an antibiotic for you.  Follow-up with your primary care or return to the ER for any worsening.

## 2020-07-21 NOTE — ED Notes (Signed)
See triage note  Presents with right ear pain and sore throat since last weds  Denies any fever or cough

## 2020-07-21 NOTE — ED Provider Notes (Signed)
[facility] Emergency Department Provider Note  ____________________________________________   Event Date/Time   First MD Initiated Contact with Patient 07/21/20 1837     (approximate)  I have reviewed the triage vital signs and the nursing notes.   HISTORY  Chief Complaint Sore Throat, Otalgia, and Nasal Congestion   HPI Julie Tanner is a 45 y.o. female who presents to the emergency department for sore throat, ear pain and nasal congestion.  Patient reports sore throat started this past Thursday, 5 days ago.  She states she has been using over-the-counter medications with minimal relief.  Reports the associated ear pain in the right ear began 1 to 2 days after the sore throat, and noticed nasal congestion today.  She reports a very mild cough yesterday, denies any now.  Denies any recent fevers.  States that her granddaughter was recently tested positive for influenza.  She denies any abdominal pain, nausea vomiting or diarrhea.       History reviewed. No pertinent past medical history.  There are no problems to display for this patient.   History reviewed. No pertinent surgical history.  Prior to Admission medications   Medication Sig Start Date End Date Taking? Authorizing Provider  lidocaine (XYLOCAINE) 2 % solution Use as directed 15 mLs in the mouth or throat as needed for mouth pain. 07/21/20  Yes Marlana Salvage, PA    Allergies Penicillins  History reviewed. No pertinent family history.  Social History Social History   Tobacco Use  . Smoking status: Former Research scientist (life sciences)  . Smokeless tobacco: Never Used  Substance Use Topics  . Alcohol use: No  . Drug use: No    Review of Systems Constitutional: No fever/chills Eyes: No visual changes. ENT: + sore throat, + right ear pain,+ nasal congestion Cardiovascular: Denies chest pain. Respiratory: Denies shortness of breath. Gastrointestinal: No abdominal pain.  No nausea, no vomiting.   No diarrhea.  No constipation. Genitourinary: Negative for dysuria. Musculoskeletal: Negative for back pain. Skin: Negative for rash. Neurological: Negative for headaches, focal weakness or numbness.   ____________________________________________   PHYSICAL EXAM:  VITAL SIGNS: ED Triage Vitals  Enc Vitals Group     BP 07/21/20 1841 (!) 161/86     Pulse Rate 07/21/20 1841 95     Resp 07/21/20 1841 17     Temp 07/21/20 1841 98.4 F (36.9 C)     Temp Source 07/21/20 1841 Oral     SpO2 07/21/20 1841 100 %     Weight 07/21/20 1842 214 lb (97.1 kg)     Height 07/21/20 1842 5\' 3"  (1.6 m)     Head Circumference --      Peak Flow --      Pain Score 07/21/20 1842 10     Pain Loc --      Pain Edu? --      Excl. in [place]? --    Constitutional: Alert and oriented. Well appearing and in no acute distress. Eyes: Conjunctivae are normal. PERRL. EOMI. Head: Atraumatic. Ears: The bilateral TMs are visualized.  The right TM does have clear fluid level line posterior, but no bulging or erythema.  Left TM is normal. Nose: Mild congestion/rhinnorhea. Mouth/Throat: Mucous membranes are moist.  Oropharynx with cobblestoning but no gross erythema, no tonsillar enlargement or exudate. Neck: No stridor.   Cardiovascular: Normal rate, regular rhythm. Grossly normal heart sounds.  Good peripheral circulation. Respiratory: Normal respiratory effort.  No retractions. Lungs CTAB. Gastrointestinal: Soft and nontender. No distention.  No abdominal bruits. No CVA tenderness. Musculoskeletal: No lower extremity tenderness nor edema.  No joint effusions. Neurologic:  Normal speech and language. No gross focal neurologic deficits are appreciated. No gait instability. Skin:  Skin is warm, dry and intact. No rash noted. Psychiatric: Mood and affect are normal. Speech and behavior are normal.  ____________________________________________   LABS (all labs ordered are listed, but only abnormal results are  displayed)  Labs Reviewed  GROUP A STREP BY PCR  SARS CORONAVIRUS 2 (TAT 6-24 HRS)    ____________________________________________   INITIAL IMPRESSION / ASSESSMENT AND PLAN / ED COURSE  As part of my medical decision making, I reviewed the following data within the [facility] notes reviewed and incorporated, Labs reviewed and Notes from prior ED visits        Patient is a 45 year old female who presents to the emergency department for evaluation of sore throat, ear pain and nasal congestion beginning this past Thursday with recent ill contact of her granddaughter tested positive for influenza.  She denies any fevers, chest pain, shortness of breath, abdominal pain, nausea vomiting or diarrhea.  In triage, patient is afebrile with mild hypertension but otherwise normal vital signs.  Physical exam as above.  Findings most consistent with viral URI with pharyngitis.  Did offer the patient strep and COVID testing, will notify her if she is positive for either.  Deferred flu testing given that she is outside of the Tamiflu window for benefit.  In the interim, discussed Flonase for nasal congestion and this will likely also help her sore throat if this is from postnasal drip.  Also provided viscous lidocaine for topical use for her throat pain.  Return precautions were discussed, patient amenable with plan and stable time for outpatient follow-up.      ____________________________________________   FINAL CLINICAL IMPRESSION(S) / ED DIAGNOSES  Final diagnoses:  Viral URI  Pharyngitis, unspecified etiology     ED Discharge Orders         Ordered    lidocaine (XYLOCAINE) 2 % solution  As needed        07/21/20 1920          *Please note:  Julie Tanner was evaluated in Emergency Department on 07/21/2020 for the symptoms described in the history of present illness. She was evaluated in the context of the global COVID-19 pandemic, which necessitated  consideration that the patient might be at risk for infection with the SARS-CoV-2 virus that causes COVID-19. Institutional protocols and algorithms that pertain to the evaluation of patients at risk for COVID-19 are in a state of rapid change based on information released by regulatory bodies including the CDC and federal and state organizations. These policies and algorithms were followed during the patient's care in the ED.  Some ED evaluations and interventions may be delayed as a result of limited staffing during and the pandemic.*   Note:  This document was prepared using Dragon voice recognition software and may include unintentional dictation errors.   Marlana Salvage, PA 07/21/20 2105    Blake Divine, MD 07/22/20 [phone]

## 2020-07-22 LAB — SARS CORONAVIRUS 2 (TAT 6-24 HRS): SARS Coronavirus 2: NEGATIVE

## 2022-03-04 ENCOUNTER — Other Ambulatory Visit: Payer: Self-pay

## 2022-03-04 ENCOUNTER — Emergency Department: Payer: Self-pay

## 2022-03-04 ENCOUNTER — Emergency Department
Admission: EM | Admit: 2022-03-04 | Discharge: 2022-03-04 | Disposition: A | Discharge status: Home / Self Care | Payer: Self-pay | Attending: Emergency Medicine | Admitting: Emergency Medicine

## 2022-03-04 DIAGNOSIS — D509 Iron deficiency anemia, unspecified: Secondary | ICD-10-CM | POA: Insufficient documentation

## 2022-03-04 DIAGNOSIS — D649 Anemia, unspecified: Secondary | ICD-10-CM

## 2022-03-04 DIAGNOSIS — J101 Influenza due to other identified influenza virus with other respiratory manifestations: Secondary | ICD-10-CM | POA: Insufficient documentation

## 2022-03-04 DIAGNOSIS — D219 Benign neoplasm of connective and other soft tissue, unspecified: Secondary | ICD-10-CM

## 2022-03-04 DIAGNOSIS — Z1152 Encounter for screening for COVID-19: Secondary | ICD-10-CM | POA: Insufficient documentation

## 2022-03-04 HISTORY — DX: Anemia, unspecified: D64.9

## 2022-03-04 LAB — CBC WITH DIFFERENTIAL/PLATELET
Abs Immature Granulocytes: 0.02 10*3/uL (ref 0.00–0.07)
Abs Immature Granulocytes: 0.03 10*3/uL (ref 0.00–0.07)
Basophils Absolute: 0 10*3/uL (ref 0.0–0.1)
Basophils Absolute: 0 10*3/uL (ref 0.0–0.1)
Basophils Relative: 0 %
Basophils Relative: 0 %
Eosinophils Absolute: 0.1 10*3/uL (ref 0.0–0.5)
Eosinophils Absolute: 0.1 10*3/uL (ref 0.0–0.5)
Eosinophils Relative: 1 %
Eosinophils Relative: 1 %
HCT: 16.4 % — ABNORMAL LOW (ref 36.0–46.0)
HCT: 25.3 % — ABNORMAL LOW (ref 36.0–46.0)
Hemoglobin: 3.9 g/dL — CL (ref 12.0–15.0)
Hemoglobin: 7.6 g/dL — ABNORMAL LOW (ref 12.0–15.0)
Immature Granulocytes: 0 %
Immature Granulocytes: 0 %
Lymphocytes Relative: 31 %
Lymphocytes Relative: 27 %
Lymphs Abs: 1.7 10*3/uL (ref 0.7–4.0)
Lymphs Abs: 2 10*3/uL (ref 0.7–4.0)
MCH: 13.8 pg — ABNORMAL LOW (ref 26.0–34.0)
MCH: 20.4 pg — ABNORMAL LOW (ref 26.0–34.0)
MCHC: 23.8 g/dL — ABNORMAL LOW (ref 30.0–36.0)
MCHC: 30 g/dL (ref 30.0–36.0)
MCV: 58 fL — ABNORMAL LOW (ref 80.0–100.0)
MCV: 67.8 fL — ABNORMAL LOW (ref 80.0–100.0)
Monocytes Absolute: 0.6 10*3/uL (ref 0.1–1.0)
Monocytes Absolute: 0.6 10*3/uL (ref 0.1–1.0)
Monocytes Relative: 11 %
Monocytes Relative: 8 %
Neutro Abs: 3 10*3/uL (ref 1.7–7.7)
Neutro Abs: 4.7 10*3/uL (ref 1.7–7.7)
Neutrophils Relative %: 57 %
Neutrophils Relative %: 64 %
Platelets: 520 10*3/uL — ABNORMAL HIGH (ref 150–400)
Platelets: 449 10*3/uL — ABNORMAL HIGH (ref 150–400)
RBC: 2.83 MIL/uL — ABNORMAL LOW (ref 3.87–5.11)
RBC: 3.73 MIL/uL — ABNORMAL LOW (ref 3.87–5.11)
RDW: 21.1 % — ABNORMAL HIGH (ref 11.5–15.5)
Smear Review: NORMAL
WBC: 5.4 10*3/uL (ref 4.0–10.5)
WBC: 7.5 10*3/uL (ref 4.0–10.5)
nRBC: 0.7 % — ABNORMAL HIGH (ref 0.0–0.2)
nRBC: 0.3 % — ABNORMAL HIGH (ref 0.0–0.2)

## 2022-03-04 LAB — BASIC METABOLIC PANEL
Anion gap: 8 (ref 5–15)
BUN: 9 mg/dL (ref 6–20)
CO2: 24 mmol/L (ref 22–32)
Calcium: 8.4 mg/dL — ABNORMAL LOW (ref 8.9–10.3)
Chloride: 108 mmol/L (ref 98–111)
Creatinine, Ser: 0.62 mg/dL (ref 0.44–1.00)
GFR, Estimated: >60 mL/min (ref >60)
Glucose, Bld: 98 mg/dL (ref 70–99)
Potassium: 3.1 mmol/L — ABNORMAL LOW (ref 3.5–5.1)
Sodium: 140 mmol/L (ref 135–145)

## 2022-03-04 LAB — ABO/RH: ABO/RH(D): B POS

## 2022-03-04 LAB — RESP PANEL BY RT-PCR (RSV, FLU A&B, COVID)  RVPGX2
Influenza A by PCR: POSITIVE — AB
Influenza B by PCR: NEGATIVE
Resp Syncytial Virus by PCR: NEGATIVE
SARS Coronavirus 2 by RT PCR: NEGATIVE

## 2022-03-04 LAB — TROPONIN I (HIGH SENSITIVITY)
Troponin I (High Sensitivity): 9 ng/L (ref <18)
Troponin I (High Sensitivity): 10 ng/L (ref <18)

## 2022-03-04 LAB — HCG, QUANTITATIVE, PREGNANCY: hCG, Beta Chain, Quant, S: <1 m[IU]/mL (ref <5)

## 2022-03-04 LAB — PREPARE RBC (CROSSMATCH)

## 2022-03-04 MED ORDER — NORGESTIMATE-ETH ESTRADIOL 0.25-35 MG-MCG PO TABS: 1.0000 | ORAL_TABLET | Freq: Every day | ORAL | 11 refills | Status: DC

## 2022-03-04 MED ORDER — SODIUM CHLORIDE 0.9 % IV SOLN
10.0000 mL/h | Freq: Once | INTRAVENOUS | Status: AC
  Administered 2022-03-04: 10 mL/h via INTRAVENOUS

## 2022-03-04 MED ORDER — CALCIUM GLUCONATE-NACL 1-0.675 GM/50ML-% IV SOLN
1.0000 g | Freq: Once | INTRAVENOUS | Status: AC
  Administered 2022-03-04: 1000 mg via INTRAVENOUS
  Filled 2022-03-04: qty 50

## 2022-03-04 MED ORDER — POTASSIUM CHLORIDE CRYS ER 20 MEQ PO TBCR
20.0000 meq | EXTENDED_RELEASE_TABLET | Freq: Once | ORAL | Status: AC
  Administered 2022-03-04: 20 meq via ORAL
  Filled 2022-03-04: qty 1

## 2022-03-04 NOTE — ED Notes (Signed)
Pt ambulated independently to restroom

## 2022-03-04 NOTE — ED Notes (Signed)
Pt signed written consent for blood transfusion.

## 2022-03-04 NOTE — ED Notes (Signed)
Second unit of blood complete.

## 2022-03-04 NOTE — ED Notes (Signed)
Charge RN made aware of critical lab and needing bed

## 2022-03-04 NOTE — ED Notes (Signed)
Pt arrived with complaint of weakness and fatigue. Pt states she has hx of fibroids. Pt has been seen for iron infusions and was told she would possibly need a hysterectomy. Pt endorses SOB when ambulating. Pt denies CP, N/V/D, fevers. Pt reports she has been on her period for a month now and right now it is light.   Pt alert and oriented x4. Pt has 2+ bilateral radial pulses. Pt denies concerns or needs at this time.

## 2022-03-04 NOTE — ED Notes (Signed)
1st unit of blood complete

## 2022-03-04 NOTE — ED Triage Notes (Addendum)
Patient complains of dizziness shortness of breath.  Reports legs have been aching since Wednesday.  Patient has hx of anemia and mucous membranes are white and conjuctivsa are white. Patient reports menstural cycle has been on for a month

## 2022-03-04 NOTE — ED Notes (Signed)
RN made aware of pt in room and EDT placing pt on monitor

## 2022-03-04 NOTE — ED Notes (Signed)
Patient transported to Ultrasound 

## 2022-03-04 NOTE — ED Provider Notes (Signed)
[facility] Provider Note    None    (approximate)   History   Dizziness   HPI  Julie Tanner is a 46 y.o. female   Past medical history of fibroids, iron deficient anemia who presents to the emergency department with lightheadedness, leg pain bilaterally, exertional dyspnea.    She has a history of iron deficient anemia requiring iron transfusions and spoke with gynecologist at [facility] regarding her fibroids and had been on hormone prescription which helped with her heavy and prolonged periods in the past but due to insurance problems she lost touch with these providers and stopped transfusions approximately 4 years ago.  History was obtained via the patient.  I reviewed external medical notes including multiple visits in 2019 for iron infusions as well as a gynecology note in 2018 that did not noted her iron deficient anemia and fibroids in her uterus that were managed with hormonal therapy after discussion of hysterectomy versus hormone therapy.      Physical Exam   Triage Vital Signs: ED Triage Vitals [03/04/22 0705]  Enc Vitals Group     BP 127/69     Pulse Rate 100     Resp 18     Temp 98.5 F (36.9 C)     Temp Source Oral     SpO2 100 %     Weight 214 lb (97.1 kg)     Height '5\' 2"'$  (1.575 m)     Head Circumference      Peak Flow      Pain Score 2     Pain Loc      Pain Edu?      Excl. in [place]?     Most recent vital signs: Vitals:   03/04/22 1454 03/04/22 1510  BP: 115/65 109/65  Pulse: 86 88  Resp: 20 20  Temp: 99.3 F (37.4 C) 99.1 F (37.3 C)  SpO2: 100% 100%    General: Awake, no distress.  CV:  Good peripheral perfusion.  Resp:  Normal effort.  Abd:  No distention.  Other:  Conjunctiva pale appearing hemodynamics appropriate and reassuring normotension, heart rate is 100, no respiratory distress, no hypoxemia.  External vaginal exam shows a dry pad and no active bleeding.  Abdomen is soft and nontender to palpation   ED  Results / Procedures / Treatments   Labs (all labs ordered are listed, but only abnormal results are displayed) Labs Reviewed  RESP PANEL BY RT-PCR (RSV, FLU A&B, COVID)  RVPGX2 - Abnormal; Notable for the following components:      Result Value   Influenza A by PCR POSITIVE (*)    All other components within normal limits  CBC WITH DIFFERENTIAL/PLATELET - Abnormal; Notable for the following components:   RBC 2.83 (*)    Hemoglobin 3.9 (*)    HCT 16.4 (*)    MCV 58.0 (*)    MCH 13.8 (*)    MCHC 23.8 (*)    RDW 21.1 (*)    Platelets 520 (*)    nRBC 0.7 (*)    All other components within normal limits  BASIC METABOLIC PANEL - Abnormal; Notable for the following components:   Potassium 3.1 (*)    Calcium 8.4 (*)    All other components within normal limits  HCG, QUANTITATIVE, PREGNANCY  TYPE AND SCREEN  PREPARE RBC (CROSSMATCH)  ABO/RH  TROPONIN I (HIGH SENSITIVITY)  TROPONIN I (HIGH SENSITIVITY)     I reviewed labs and they are  notable for she has a iron deficient anemia with a hemoglobin of 3.9 as well as potassium 3.1 and she is positive for influenza A.  EKG  ED ECG REPORT I, Lucillie Garfinkel, the attending physician, personally viewed and interpreted this ECG.   Date: 03/04/2022  EKG Time: 0707  Rate: 105  Rhythm: normal sinus rhythm  Axis: nl  Intervals:none  ST&T Change: No acute ischemic changes    RADIOLOGY I independently reviewed and interpreted chest x-ray and see no obvious focal consolidations or pneumothorax   PROCEDURES:  Critical Care performed: Yes, see critical care procedure note(s)  .Critical Care  Performed by: Lucillie Garfinkel, MD Authorized by: Lucillie Garfinkel, MD   Critical care provider statement:    Critical care time (minutes):  30   Critical care was necessary to treat or prevent imminent or life-threatening deterioration of the following conditions: Critical anemia requiring emergent blood transfusion.   Critical care was time spent  personally by me on the following activities:  Development of treatment plan with patient or surrogate, discussions with consultants, evaluation of patient's response to treatment, examination of patient, ordering and review of laboratory studies, ordering and review of radiographic studies, ordering and performing treatments and interventions, pulse oximetry, re-evaluation of patient's condition and review of old charts    [place] ED: Medications  0.9 %  sodium chloride infusion (has no administration in time range)  calcium gluconate 1 g/ 50 mL sodium chloride IVPB (0 mg Intravenous Stopped 03/04/22 1006)  potassium chloride SA (KLOR-CON M) CR tablet 20 mEq (20 mEq Oral Given 03/04/22 1249)    Consultants:  I spoke with [facility] gynecology consultant regarding care plan for this patient.   IMPRESSION / MDM / ASSESSMENT AND PLAN / ED COURSE  I reviewed the triage vital signs and the nursing notes.                              Differential diagnosis includes, but is not limited to, abnormal uterine bleeding, fibroid uterus, symptomatic anemia, iron deficiency   The patient is on the cardiac monitor to evaluate for evidence of arrhythmia and/or significant heart rate changes.  MDM: Patient with a history of fibroids and iron deficiency anemia as well as anemia of chronic blood loss who presents with several weeks of progressive symptoms of anemia including exertional dyspnea, cramping pain in her legs, fatigue with a critically low hemoglobin of 3.9.  I expect that this has been chronic given a hemoglobin in the sixes obtained 2 years ago and symptoms of chronic blood loss from her heavy menstrual periods and fibroid, no blood transfusion or iron infusions since that time.  She would like to reconnect with gynecology about medications and also is willing to undergo hysterectomy now.  She would like to transfer her care from [facility] which she lost touch with more than 4 years ago  and instead connect with [facility] gynecology.  Obtain a new transvaginal to assess for intrauterine pathology at this time to facilitate consultation and transfer of care.  She tested positive for influenza so I reviewed infectious symptoms with her and she states a mild cough for the last 2 days but no fever, no myalgias.  We discussed side effect profile and potential benefits of antivirals and she declines at this time.  Discussed with [facility] gynecology who set up an outpatient appointment for this patient later next week, plan for OCP, finish blood transfusion  and discharge.  Patient's presentation is most consistent with acute presentation with potential threat to life or bodily function.       FINAL CLINICAL IMPRESSION(S) / ED DIAGNOSES   Final diagnoses:  Symptomatic anemia  Microcytic anemia  Influenza A     Rx / DC Orders   ED Discharge Orders          Ordered    norgestimate-ethinyl estradiol (ORTHO-CYCLEN) 0.25-35 MG-MCG tablet  Daily        03/04/22 1326             Note:  This document was prepared using Dragon voice recognition software and may include unintentional dictation errors.    Lucillie Garfinkel, MD 03/04/22 [phone]

## 2022-03-04 NOTE — ED Provider Notes (Signed)
-----------------------------------------   11:03 PM on 03/04/2022 -----------------------------------------   I took over care on this patient from Dr. Ronalee Red.  The patient has received 3 units of PRBCs and her repeat hemoglobin is 7.6.  She is asymptomatic at this time and feels well to go home as per the original plan.  I gave her strict return precautions and she expressed understanding.  She has a follow-up plan in place.   Arta Silence, MD 03/04/22 2303

## 2022-03-04 NOTE — Discharge Instructions (Addendum)
Take Ortho-Cyclen as prescribed. The [facility] clinic gynecology has schedule a follow-up appointment for you on December 27.  Check your MyChart for the details of this appointment or call the clinic to confirm.  Thank you for choosing [place] for your health care today!  Please see your primary doctor this week for a follow up appointment.   Sometimes, in the early stages of certain disease courses it is difficult to detect in the emergency department evaluation -- so, it is important that you continue to monitor your symptoms and call your doctor right away or return to the emergency department if you develop any new or worsening symptoms.  Please go to the following website to schedule new (and existing) patient appointments:   [email or web address]  If you do not have a primary doctor try calling the following clinics to establish care:  If you have insurance:  [facility] [phone] [place] [address]   [facility]  [phone] [address] [place] [address]   If you do not have insurance:  [facility]  [phone] [address] [place] [address]   The following is another list of primary care offices in the area who are accepting new patients at this time.  Please reach out to one of them directly and let them know you would like to schedule an appointment to follow up on an Emergency Department visit, and/or to establish a new primary care provider (PCP).  There are likely other primary care clinics in the are who are accepting new patients, but this is an excellent place to start:  [place] physician: Dr Lavon Paganini [address] [place], [place] [address] [phone]  [facility] Lead Physician: Dr Steele Sizer [address], [place], [place] [address] [phone]  [place] Physician: Dr  Park Liter [address] [place], [place] [address] [phone]  [facility] Lead Physician: Dr Dewaine Oats [place], [place], [place] [address] [phone]  [facility] Physician: Dr Halina Maidens [address] [place], [place] [address] [phone]   It was my pleasure to care for you today.   Ho[place] Brunette Jacelyn Grip, MD

## 2022-03-05 LAB — BPAM RBC
Blood Product Expiration Date: 202401122359
Blood Product Expiration Date: 202401122359
Blood Product Expiration Date: 202401192359
ISSUE DATE / TIME: 202312221202
ISSUE DATE / TIME: 202312221447
ISSUE DATE / TIME: 202312221746
Unit Type and Rh: 5100
Unit Type and Rh: 7300
Unit Type and Rh: 7300

## 2022-03-05 LAB — TYPE AND SCREEN
ABO/RH(D): B POS
Antibody Screen: NEGATIVE
Unit division: 0
Unit division: 0
Unit division: 0

## 2022-03-10 ENCOUNTER — Encounter: Payer: Self-pay | Admitting: Emergency Medicine

## 2022-03-10 ENCOUNTER — Other Ambulatory Visit: Payer: Self-pay

## 2022-03-10 ENCOUNTER — Emergency Department
Admission: EM | Admit: 2022-03-10 | Discharge: 2022-03-10 | Disposition: A | Discharge status: Home / Self Care | Payer: Self-pay | Attending: Emergency Medicine | Admitting: Emergency Medicine

## 2022-03-10 ENCOUNTER — Emergency Department: Payer: Self-pay

## 2022-03-10 DIAGNOSIS — Z1152 Encounter for screening for COVID-19: Secondary | ICD-10-CM | POA: Insufficient documentation

## 2022-03-10 DIAGNOSIS — R519 Headache, unspecified: Secondary | ICD-10-CM | POA: Insufficient documentation

## 2022-03-10 LAB — BASIC METABOLIC PANEL
Anion gap: 8 (ref 5–15)
BUN: 7 mg/dL (ref 6–20)
CO2: 23 mmol/L (ref 22–32)
Calcium: 8.5 mg/dL — ABNORMAL LOW (ref 8.9–10.3)
Chloride: 107 mmol/L (ref 98–111)
Creatinine, Ser: 0.47 mg/dL (ref 0.44–1.00)
GFR, Estimated: >60 mL/min (ref >60)
Glucose, Bld: 95 mg/dL (ref 70–99)
Potassium: 3.5 mmol/L (ref 3.5–5.1)
Sodium: 138 mmol/L (ref 135–145)

## 2022-03-10 LAB — CBC
HCT: 31.1 % — ABNORMAL LOW (ref 36.0–46.0)
Hemoglobin: 8.7 g/dL — ABNORMAL LOW (ref 12.0–15.0)
MCH: 19.8 pg — ABNORMAL LOW (ref 26.0–34.0)
MCHC: 28 g/dL — ABNORMAL LOW (ref 30.0–36.0)
MCV: 70.8 fL — ABNORMAL LOW (ref 80.0–100.0)
Platelets: 291 10*3/uL (ref 150–400)
RBC: 4.39 MIL/uL (ref 3.87–5.11)
WBC: 10.1 10*3/uL (ref 4.0–10.5)
nRBC: 0 % (ref 0.0–0.2)

## 2022-03-10 LAB — RESP PANEL BY RT-PCR (RSV, FLU A&B, COVID)  RVPGX2
Influenza A by PCR: NEGATIVE
Influenza B by PCR: NEGATIVE
Resp Syncytial Virus by PCR: NEGATIVE
SARS Coronavirus 2 by RT PCR: NEGATIVE

## 2022-03-10 LAB — SAMPLE TO BLOOD BANK

## 2022-03-10 MED ORDER — METOCLOPRAMIDE HCL 5 MG/ML IJ SOLN
10.0000 mg | Freq: Once | INTRAMUSCULAR | Status: AC
  Administered 2022-03-10: 10 mg via INTRAVENOUS
  Filled 2022-03-10: qty 2

## 2022-03-10 MED ORDER — SODIUM CHLORIDE 0.9 % IV BOLUS
1000.0000 mL | Freq: Once | INTRAVENOUS | Status: AC
  Administered 2022-03-10: 1000 mL via INTRAVENOUS

## 2022-03-10 MED ORDER — DIPHENHYDRAMINE HCL 50 MG/ML IJ SOLN
25.0000 mg | Freq: Once | INTRAMUSCULAR | Status: AC
  Administered 2022-03-10: 25 mg via INTRAVENOUS
  Filled 2022-03-10: qty 1

## 2022-03-10 MED ORDER — KETOROLAC TROMETHAMINE 15 MG/ML IJ SOLN
15.0000 mg | Freq: Once | INTRAMUSCULAR | Status: AC
  Administered 2022-03-10: 15 mg via INTRAVENOUS
  Filled 2022-03-10: qty 1

## 2022-03-10 NOTE — ED Provider Notes (Signed)
[facility] Provider Note    Event Date/Time   First MD Initiated Contact with Patient 03/10/22 1458     (approximate)   History   Headache   HPI  Julie Tanner is a 46 y.o. female past medical history significant for anemia requiring recent blood transfusion, presents to the emergency department with a headache.  Endorses 3 days of a mild headache that has been progressively worsening.  Yesterday and the day before had some flashes of light but has not had any change in vision today.  Endorses left-sided headache.  Denies vomiting but does endorse nausea.  No recent falls or trauma.  States that she received a blood transfusion recently and was told that if she had any abnormal symptoms that she should return to the emergency department to be evaluated.  Denies any extremity numbness or weakness.  No chest pain or shortness of breath.  No prior history of headaches.     Physical Exam   Triage Vital Signs: ED Triage Vitals  Enc Vitals Group     BP 03/10/22 1257 (!) 157/82     Pulse Rate 03/10/22 1257 (!) 55     Resp 03/10/22 1257 17     Temp 03/10/22 1257 98.2 F (36.8 C)     Temp Source 03/10/22 1228 Oral     SpO2 03/10/22 1257 97 %     Weight 03/10/22 1303 208 lb (94.3 kg)     Height 03/10/22 1303 '5\' 2"'$  (1.575 m)     Head Circumference --      Peak Flow --      Pain Score 03/10/22 1301 10     Pain Loc --      Pain Edu? --      Excl. in [place]? --     Most recent vital signs: Vitals:   03/10/22 1257  BP: (!) 157/82  Pulse: (!) 55  Resp: 17  Temp: 98.2 F (36.8 C)  SpO2: 97%    Physical Exam Constitutional:      Appearance: She is well-developed.  HENT:     Head: Atraumatic.  Eyes:     Conjunctiva/sclera: Conjunctivae normal.  Cardiovascular:     Rate and Rhythm: Regular rhythm.  Pulmonary:     Effort: No respiratory distress.  Abdominal:     General: There is no distension.  Musculoskeletal:        General: Normal range of  motion.     Cervical back: Normal range of motion.  Skin:    General: Skin is warm.  Neurological:     Mental Status: She is alert. Mental status is at baseline.     IMPRESSION / MDM / ASSESSMENT AND PLAN / ED COURSE  I reviewed the triage vital signs and the nursing notes.  Differential diagnosis including migraine headache, rebound headache, intracranial hemorrhage, cerebral venous thrombosis.  Low suspicion for meningitis, no fever and no meningismus symptoms.  RADIOLOGY I independently reviewed imaging, my interpretation of imaging: CT head shows no signs of intracranial hemorrhage or infarction.  CT scan was read as air-fluid levels in the right maxillary sinus concerning for possible sinusitis.  Patient without symptoms of sinusitis  LABS (all labs ordered are listed, but only abnormal results are displayed) Labs interpreted as -  Anemia but hemoglobin has improved when compared to her prior at 8.7.  No other significant electrolyte abnormalities  Labs Reviewed  BASIC METABOLIC PANEL - Abnormal; Notable for the following components:  Result Value   Calcium 8.5 (*)    All other components within normal limits  CBC - Abnormal; Notable for the following components:   Hemoglobin 8.7 (*)    HCT 31.1 (*)    MCV 70.8 (*)    MCH 19.8 (*)    MCHC 28.0 (*)    All other components within normal limits  RESP PANEL BY RT-PCR (RSV, FLU A&B, COVID)  RVPGX2  SAMPLE TO BLOOD BANK    TREATMENT   IV fluid, Toradol, Reglan and Benadryl  On reevaluation patient had resolution of her headache.  States that she is feeling much better and her symptoms have resolved.  Most likely with a primary headache.  Discussed return precautions and follow-up with her primary care provider.   PROCEDURES:  Critical Care performed: No  Procedures  Patient's presentation is most consistent with acute presentation with potential threat to life or bodily function.   MEDICATIONS ORDERED IN  ED: Medications  sodium chloride 0.9 % bolus 1,000 mL (1,000 mLs Intravenous Bolus 03/10/22 1623)  ketorolac (TORADOL) 15 MG/ML injection 15 mg (15 mg Intravenous Given 03/10/22 1623)  diphenhydrAMINE (BENADRYL) injection 25 mg (25 mg Intravenous Given 03/10/22 1623)  metoCLOPramide (REGLAN) injection 10 mg (10 mg Intravenous Given 03/10/22 1623)    FINAL CLINICAL IMPRESSION(S) / ED DIAGNOSES   Final diagnoses:  Acute nonintractable headache, unspecified headache type     Rx / DC Orders   ED Discharge Orders     None        Note:  This document was prepared using Dragon voice recognition software and may include unintentional dictation errors.   Nathaniel Man, MD 03/10/22 [phone]

## 2022-03-10 NOTE — ED Triage Notes (Signed)
Patient c/o left sided headache and nausea x 3 days. Patient states she had a blood transfusion last Thursday and was told to come to ED for evaluation if any symptoms.

## 2022-08-12 ENCOUNTER — Telehealth: Payer: Self-pay | Admitting: *Deleted

## 2022-10-24 ENCOUNTER — Ambulatory Visit
Admission: RE | Admit: 2022-10-24 | Discharge: 2022-10-24 | Disposition: A | Discharge status: Home / Self Care | Payer: Self-pay | Source: Ambulatory Visit | Attending: Obstetrics and Gynecology | Admitting: Obstetrics and Gynecology

## 2022-10-24 ENCOUNTER — Other Ambulatory Visit: Payer: Self-pay | Admitting: Obstetrics and Gynecology

## 2022-10-24 ENCOUNTER — Ambulatory Visit: Payer: Self-pay | Attending: Obstetrics and Gynecology | Admitting: Hematology and Oncology

## 2022-10-24 VITALS — BP 154/86 | Wt 215.7 lb

## 2022-10-24 DIAGNOSIS — Z1231 Encounter for screening mammogram for malignant neoplasm of breast: Secondary | ICD-10-CM | POA: Insufficient documentation

## 2022-10-24 NOTE — Progress Notes (Signed)
Ms. Julie Tanner is a 47 y.o. female who presents to [facility] clinic today with no complaints.    Pap Smear: Pap not smear completed today. Last Pap smear was 08/30/22 at [facility] clinic and was ASCUS/ HPV- Per patient has no history of an abnormal Pap smear. Last Pap smear result is available in Epic. 08/11/2022 Normal/ HPV-. Patient with large fibroid being considered for hysterectomy.    Physical exam: Breasts Breasts symmetrical. No skin abnormalities bilateral breasts. No nipple retraction bilateral breasts. No nipple discharge bilateral breasts. No lymphadenopathy. No lumps palpated bilateral breasts.        Pelvic/Bimanual Pap is not indicated today    Smoking History: Patient has never smoked and was not referred to quit line.    Patient Navigation: Patient education provided. Access to services provided for patient through [facility] program. No interpreter provided. No transportation provided   Colorectal Cancer Screening: Per patient has never had colonoscopy completed No complaints today. Will send FIT test once decision made about surgery, per patient request.    Breast and Cervical Cancer Risk Assessment: Patient does not have family history of breast cancer, known genetic mutations, or radiation treatment to the chest before age 76. Patient does not have history of cervical dysplasia, immunocompromised, or DES exposure in-utero.  Risk Assessment   No risk assessment data     A: [facility] exam without pap smear No complaints with benign exam.   P: Referred patient to the [facility] for a screening mammogram. Appointment scheduled 10/24/22.  Ilda Basset A, NP 10/24/2022 2:25 PM

## 2022-10-24 NOTE — Patient Instructions (Addendum)
Taught Julie Tanner about self breast awareness and gave educational materials to take home. Patient did not need a Pap smear today due to last Pap smear was in 08/30/22 per patient.  Let her know [facility] will cover Pap smears every 5 years unless has a history of abnormal Pap smears. Referred patient to the [facility] for screening mammogram. Appointment scheduled for 10/24/2022. Patient aware of appointment and will be there. Let patient know will follow up with her within the next couple weeks with results. Julie Tanner verbalized understanding.  Pascal Lux, NP 2:27 PM

## 2022-10-26 ENCOUNTER — Other Ambulatory Visit: Payer: Self-pay | Admitting: Obstetrics and Gynecology

## 2022-10-26 DIAGNOSIS — N63 Unspecified lump in unspecified breast: Secondary | ICD-10-CM

## 2022-10-26 DIAGNOSIS — R928 Other abnormal and inconclusive findings on diagnostic imaging of breast: Secondary | ICD-10-CM

## 2022-11-03 ENCOUNTER — Other Ambulatory Visit: Payer: Self-pay

## 2022-11-03 ENCOUNTER — Inpatient Hospital Stay: Admission: RE | Admit: 2022-11-03 | Payer: Self-pay | Source: Ambulatory Visit

## 2022-11-30 ENCOUNTER — Ambulatory Visit
Admission: RE | Admit: 2022-11-30 | Discharge: 2022-11-30 | Disposition: A | Discharge status: Home / Self Care | Payer: Self-pay | Source: Ambulatory Visit | Attending: Obstetrics and Gynecology | Admitting: Obstetrics and Gynecology

## 2022-11-30 DIAGNOSIS — R928 Other abnormal and inconclusive findings on diagnostic imaging of breast: Secondary | ICD-10-CM | POA: Insufficient documentation

## 2022-11-30 DIAGNOSIS — N63 Unspecified lump in unspecified breast: Secondary | ICD-10-CM | POA: Insufficient documentation

## 2022-12-01 ENCOUNTER — Other Ambulatory Visit: Payer: Self-pay | Admitting: Obstetrics and Gynecology

## 2022-12-01 DIAGNOSIS — N63 Unspecified lump in unspecified breast: Secondary | ICD-10-CM

## 2022-12-01 DIAGNOSIS — R928 Other abnormal and inconclusive findings on diagnostic imaging of breast: Secondary | ICD-10-CM

## 2022-12-13 ENCOUNTER — Ambulatory Visit
Admission: RE | Admit: 2022-12-13 | Discharge: 2022-12-13 | Disposition: A | Discharge status: Home / Self Care | Payer: Self-pay | Source: Ambulatory Visit | Attending: Obstetrics and Gynecology | Admitting: Obstetrics and Gynecology

## 2022-12-13 DIAGNOSIS — R928 Other abnormal and inconclusive findings on diagnostic imaging of breast: Secondary | ICD-10-CM | POA: Insufficient documentation

## 2022-12-13 DIAGNOSIS — N63 Unspecified lump in unspecified breast: Secondary | ICD-10-CM | POA: Insufficient documentation

## 2022-12-13 HISTORY — PX: BREAST BIOPSY: SHX20

## 2022-12-13 MED ORDER — LIDOCAINE 1 % OPTIME INJ - NO CHARGE
5.0000 mL | Freq: Once | INTRAMUSCULAR | Status: AC
  Administered 2022-12-13: 5 mL
  Filled 2022-12-13: qty 6

## 2022-12-13 MED ORDER — LIDOCAINE-EPINEPHRINE 1 %-1:100000 IJ SOLN
10.0000 mL | Freq: Once | INTRAMUSCULAR | Status: AC
  Administered 2022-12-13: 10 mL
  Filled 2022-12-13: qty 10

## 2022-12-14 LAB — SURGICAL PATHOLOGY

## 2023-02-16 ENCOUNTER — Telehealth: Payer: Self-pay | Admitting: Hematology and Oncology

## 2023-02-16 ENCOUNTER — Other Ambulatory Visit: Payer: Self-pay

## 2023-02-16 DIAGNOSIS — R590 Localized enlarged lymph nodes: Secondary | ICD-10-CM

## 2023-03-21 ENCOUNTER — Other Ambulatory Visit: Payer: Self-pay

## 2023-03-23 ENCOUNTER — Ambulatory Visit
Admission: RE | Admit: 2023-03-23 | Discharge: 2023-03-23 | Disposition: A | Discharge status: Home / Self Care | Payer: Self-pay | Source: Ambulatory Visit | Attending: Emergency Medicine | Admitting: Emergency Medicine

## 2023-03-23 VITALS — BP 140/87 | HR 77 | Temp 98.3°F

## 2023-03-23 DIAGNOSIS — K0889 Other specified disorders of teeth and supporting structures: Secondary | ICD-10-CM

## 2023-03-23 DIAGNOSIS — K047 Periapical abscess without sinus: Secondary | ICD-10-CM

## 2023-03-23 MED ORDER — CLINDAMYCIN HCL 150 MG PO CAPS: 450.0000 mg | ORAL_CAPSULE | Freq: Three times a day (TID) | ORAL | 0 refills | Status: AC

## 2023-03-23 NOTE — ED Triage Notes (Signed)
 C/o tooth abscess x 3 days.

## 2023-03-23 NOTE — Discharge Instructions (Addendum)
 Take antibiotic as directed, follow-up with PCP.  Please call and schedule appointment with your dental provider, if you have worsening symptoms or unable keep medicine down develop a fever over 101, or have worsening facial swelling go to emergency room for further evaluation of dental pain/infection.

## 2023-03-23 NOTE — ED Provider Notes (Addendum)
 [facility]    CSN: [id number] Arrival date & time: 03/23/23  1730      History   Chief Complaint Chief Complaint  Patient presents with   Abscess    Entered by patient    HPI Julie Tanner is a 48 y.o. female.   48 year old female, [facility], presents to urgent care for evaluation of left upper dental pain x 3-day, patient states he feels like when she has had a dental infection in past.  Patient denies smoking drinking or drug use, allergy to penicillin.  The history is provided by the patient. No language interpreter was used.    Past Medical History:  Diagnosis Date   Anemia     Patient Active Problem List   Diagnosis Date Noted   Pain, dental 03/23/2023   Dental infection 03/23/2023    Past Surgical History:  Procedure Laterality Date   BREAST BIOPSY Bilateral 12/13/2022   coil clip   BREAST BIOPSY Left 12/13/2022   US  LT BREAST BX W LOC DEV 1ST LESION IMG BX SPEC US  GUIDE 12/13/2022 [facility]    OB History   No obstetric history on file.      Home Medications    Prior to Admission medications   Medication Sig Start Date End Date Taking? Authorizing Provider  clindamycin  (CLEOCIN ) 150 MG capsule Take 3 capsules (450 mg total) by mouth 3 (three) times daily for 7 days. 03/23/23 03/30/23 Yes Bless Lisenby, Rilla, NP  lidocaine  (XYLOCAINE ) 2 % solution Use as directed 15 mLs in the mouth or throat as needed for mouth pain. Patient not taking: Reported on 03/04/2022 07/21/20   Rodgers, Caitlin J, PA  norgestimate -ethinyl estradiol  (ORTHO-CYCLEN) 0.25-35 MG-MCG tablet Take 1 tablet by mouth daily. 03/04/22   Cyrena Mylar, MD    Family History Family History  Problem Relation Age of Onset   Breast cancer Neg Hx     Social History Social History   Tobacco Use   Smoking status: Former   Smokeless tobacco: Never  Vaping Use   Vaping status: Never Used  Substance Use Topics   Alcohol use: No   Drug use: No     Allergies    Penicillins   Review of Systems Review of Systems  Constitutional:  Negative for fever.  HENT:  Positive for dental problem and facial swelling.   All other systems reviewed and are negative.    Physical Exam Triage Vital Signs ED Triage Vitals  Encounter Vitals Group     BP      Systolic BP Percentile      Diastolic BP Percentile      Pulse      Resp      Temp      Temp src      SpO2      Weight      Height      Head Circumference      Peak Flow      Pain Score      Pain Loc      Pain Education      Exclude from Growth Chart    No data found.  Updated Vital Signs BP (!) 140/87 (BP Location: Right Arm)   Pulse 77   Temp 98.3 F (36.8 C) (Oral)   LMP 03/16/2023 (Approximate)   SpO2 100%   Visual Acuity Right Eye Distance:   Left Eye Distance:   Bilateral Distance:    Right Eye Near:   Left Eye Near:  Bilateral Near:     Physical Exam Vitals and nursing note reviewed.  Constitutional:      General: She is not in acute distress.    Appearance: She is well-developed and well-groomed.  HENT:     Head: Normocephalic and atraumatic.     Right Ear: Tympanic membrane normal.     Left Ear: Tympanic membrane normal.     Nose: Nose normal.     Mouth/Throat:     Lips: Pink.     Mouth: Mucous membranes are moist.     Dentition: Dental tenderness present.      Comments: No tenting, no fluctuance, no trismus Eyes:     Conjunctiva/sclera: Conjunctivae normal.  Cardiovascular:     Rate and Rhythm: Normal rate and regular rhythm.     Heart sounds: No murmur heard. Pulmonary:     Effort: Pulmonary effort is normal. No respiratory distress.     Breath sounds: Normal breath sounds and air entry.  Abdominal:     Palpations: Abdomen is soft.     Tenderness: There is no abdominal tenderness.  Musculoskeletal:        General: No swelling.     Cervical back: Neck supple.  Skin:    General: Skin is warm and dry.     Capillary Refill: Capillary refill takes  less than 2 seconds.  Neurological:     General: No focal deficit present.     Mental Status: She is alert and oriented to person, place, and time.     GCS: GCS eye subscore is 4. GCS verbal subscore is 5. GCS motor subscore is 6.  Psychiatric:        Attention and Perception: Attention normal.        Mood and Affect: Mood normal.        Speech: Speech normal.        Behavior: Behavior is cooperative.      UC Treatments / Results  Labs (all labs ordered are listed, but only abnormal results are displayed) Labs Reviewed - No data to display  EKG   Radiology No results found.  Procedures Procedures (including critical care time)  Medications Ordered in UC Medications - No data to display  Initial Impression / Assessment and Plan / UC Course  I have reviewed the triage vital signs and the nursing notes.  Pertinent labs & imaging results that were available during my care of the patient were reviewed by me and considered in my medical decision making (see chart for details).    Discussed exam findings and plan of care with patient, strict go to ER precautions given.   Patient verbalized understanding to this provider.Discussed exam findings and plan of care with patient, strict go to ER precautions given.   Patient verbalized understanding to this provider.  Ddx: Dental infection, tooth fracture, abscess, dental pain Final Clinical Impressions(s) / UC Diagnoses   Final diagnoses:  Pain, dental  Dental infection     Discharge Instructions      Take antibiotic as directed, follow-up with PCP.  Please call and schedule appointment with your dental provider, if you have worsening symptoms or unable keep medicine down develop a fever over 101, or have worsening facial swelling go to emergency room for further evaluation of dental pain/infection.     ED Prescriptions     Medication Sig Dispense Auth. Provider   clindamycin  (CLEOCIN ) 150 MG capsule Take 3 capsules (450  mg total) by mouth 3 (three) times daily for  7 days. 63 capsule Aseem Sessums, NP      PDMP not reviewed this encounter.   Aminta Loose, NP 03/23/23 1814    Aminta Loose, NP 03/23/23 1815

## 2023-03-24 ENCOUNTER — Other Ambulatory Visit: Payer: Self-pay | Admitting: Obstetrics and Gynecology

## 2023-03-24 ENCOUNTER — Ambulatory Visit
Admission: RE | Admit: 2023-03-24 | Discharge: 2023-03-24 | Disposition: A | Discharge status: Home / Self Care | Payer: Self-pay | Source: Ambulatory Visit | Attending: Obstetrics and Gynecology | Admitting: Obstetrics and Gynecology

## 2023-03-24 DIAGNOSIS — R599 Enlarged lymph nodes, unspecified: Secondary | ICD-10-CM

## 2023-03-24 DIAGNOSIS — R590 Localized enlarged lymph nodes: Secondary | ICD-10-CM

## 2023-04-04 ENCOUNTER — Other Ambulatory Visit: Payer: Self-pay

## 2023-04-09 ENCOUNTER — Other Ambulatory Visit: Payer: Self-pay

## 2023-04-09 ENCOUNTER — Encounter: Payer: Self-pay | Admitting: Emergency Medicine

## 2023-04-09 ENCOUNTER — Emergency Department
Admission: EM | Admit: 2023-04-09 | Discharge: 2023-04-09 | Disposition: A | Discharge status: Home / Self Care | Payer: Self-pay | Attending: Emergency Medicine | Admitting: Emergency Medicine

## 2023-04-09 DIAGNOSIS — E876 Hypokalemia: Secondary | ICD-10-CM | POA: Insufficient documentation

## 2023-04-09 DIAGNOSIS — D649 Anemia, unspecified: Secondary | ICD-10-CM | POA: Insufficient documentation

## 2023-04-09 DIAGNOSIS — R5383 Other fatigue: Secondary | ICD-10-CM

## 2023-04-09 LAB — BASIC METABOLIC PANEL
Anion gap: 9 (ref 5–15)
BUN: 11 mg/dL (ref 6–20)
CO2: 24 mmol/L (ref 22–32)
Calcium: 8.3 mg/dL — ABNORMAL LOW (ref 8.9–10.3)
Chloride: 103 mmol/L (ref 98–111)
Creatinine, Ser: 0.64 mg/dL (ref 0.44–1.00)
GFR, Estimated: >60 mL/min (ref >60)
Glucose, Bld: 104 mg/dL — ABNORMAL HIGH (ref 70–99)
Potassium: 2.8 mmol/L — ABNORMAL LOW (ref 3.5–5.1)
Sodium: 136 mmol/L (ref 135–145)

## 2023-04-09 LAB — HEPATIC FUNCTION PANEL
ALT: 13 U/L (ref 0–44)
AST: 18 U/L (ref 15–41)
Albumin: 3.2 g/dL — ABNORMAL LOW (ref 3.5–5.0)
Alkaline Phosphatase: 60 U/L (ref 38–126)
Bilirubin, Direct: <0.1 mg/dL (ref 0.0–0.2)
Total Bilirubin: 0.4 mg/dL (ref 0.0–1.2)
Total Protein: 7.1 g/dL (ref 6.5–8.1)

## 2023-04-09 LAB — CBC
HCT: 23.1 % — ABNORMAL LOW (ref 36.0–46.0)
Hemoglobin: 6.3 g/dL — ABNORMAL LOW (ref 12.0–15.0)
MCH: 17.1 pg — ABNORMAL LOW (ref 26.0–34.0)
MCHC: 27.3 g/dL — ABNORMAL LOW (ref 30.0–36.0)
MCV: 62.6 fL — ABNORMAL LOW (ref 80.0–100.0)
Platelets: 470 10*3/uL — ABNORMAL HIGH (ref 150–400)
RBC: 3.69 MIL/uL — ABNORMAL LOW (ref 3.87–5.11)
RDW: 21.8 % — ABNORMAL HIGH (ref 11.5–15.5)
WBC: 4.4 10*3/uL (ref 4.0–10.5)
nRBC: 0 % (ref 0.0–0.2)

## 2023-04-09 LAB — PREPARE RBC (CROSSMATCH)

## 2023-04-09 LAB — MAGNESIUM: Magnesium: 2.1 mg/dL (ref 1.7–2.4)

## 2023-04-09 MED ORDER — POTASSIUM CHLORIDE CRYS ER 20 MEQ PO TBCR
40.0000 meq | EXTENDED_RELEASE_TABLET | Freq: Once | ORAL | Status: AC
  Administered 2023-04-09: 40 meq via ORAL
  Filled 2023-04-09: qty 2

## 2023-04-09 MED ORDER — SODIUM CHLORIDE 0.9 % IV SOLN
10.0000 mL/h | Freq: Once | INTRAVENOUS | Status: AC
  Administered 2023-04-09: 10 mL/h via INTRAVENOUS

## 2023-04-09 MED ORDER — POTASSIUM CHLORIDE 10 MEQ/100ML IV SOLN
10.0000 meq | Freq: Once | INTRAVENOUS | Status: AC
  Administered 2023-04-09: 10 meq via INTRAVENOUS
  Filled 2023-04-09: qty 100

## 2023-04-09 NOTE — ED Provider Notes (Signed)
[facility] Provider Note    Event Date/Time   First MD Initiated Contact with Patient 04/09/23 1154     (approximate)   History   Fatigue   HPI  Julie Tanner is a 48 year old female with history of iron deficiency anemia due to blood loss from fibroids presenting to the emergency department for evaluation of fatigue.  Last weekend, patient had heavy vaginal bleeding changing a tampon every 30 minutes, was on vacation so she did not seek care at that time.  She then developed a GI illness for several days with vomiting and diarrhea.  With her vaginal bleeding and vomiting and diarrhea have resolved, but has worsening fatigue leading her to present to the ER.  No reported chest pain or shortness of breath.  Is concerned that she may be anemic and need a blood transfusion.  Does report that she follows with OB/GYN and is planning to have a hysterectomy, but is currently awaiting insurance approval.     Physical Exam   Triage Vital Signs: ED Triage Vitals  Encounter Vitals Group     BP 04/09/23 1105 (!) 131/101     Systolic BP Percentile --      Diastolic BP Percentile --      Pulse Rate 04/09/23 1105 88     Resp 04/09/23 1105 18     Temp 04/09/23 1105 98.5 F (36.9 C)     Temp Source 04/09/23 1105 Oral     SpO2 04/09/23 1105 98 %     Weight 04/09/23 1052 212 lb (96.2 kg)     Height 04/09/23 1052 5\' 3"  (1.6 m)     Head Circumference --      Peak Flow --      Pain Score 04/09/23 1052 0     Pain Loc --      Pain Education --      Exclude from Growth Chart --     Most recent vital signs: Vitals:   04/09/23 1328 04/09/23 1600  BP: 129/69 134/72  Pulse: 67 66  Resp: 16   Temp: 98 F (36.7 C)   SpO2: 100% 98%     General: Awake, interactive  CV:  Regular rate, good peripheral perfusion.  Resp:  Unlabored respirations, lungs clear to auscultation Abd:  Nondistended, soft, nontender to palpation Neuro:  Symmetric facial movement, fluid  speech   ED Results / Procedures / Treatments   Labs (all labs ordered are listed, but only abnormal results are displayed) Labs Reviewed  CBC - Abnormal; Notable for the following components:      Result Value   RBC 3.69 (*)    Hemoglobin 6.3 (*)    HCT 23.1 (*)    MCV 62.6 (*)    MCH 17.1 (*)    MCHC 27.3 (*)    RDW 21.8 (*)    Platelets 470 (*)    All other components within normal limits  BASIC METABOLIC PANEL - Abnormal; Notable for the following components:   Potassium 2.8 (*)    Glucose, Bld 104 (*)    Calcium 8.3 (*)    All other components within normal limits  HEPATIC FUNCTION PANEL - Abnormal; Notable for the following components:   Albumin 3.2 (*)    All other components within normal limits  MAGNESIUM  TYPE AND SCREEN  PREPARE RBC (CROSSMATCH)     EKG EKG independently reviewed interpreted by myself (ER attending) demonstrates:    RADIOLOGY Imaging independently reviewed and  interpreted by myself demonstrates:    PROCEDURES:  Critical Care performed: Yes, see critical care procedure note(s)  CRITICAL CARE Performed by: Trinna Post   Total critical care time: 30 minutes  Critical care time was exclusive of separately billable procedures and treating other patients.  Critical care was necessary to treat or prevent imminent or life-threatening deterioration.  Critical care was time spent personally by me on the following activities: development of treatment plan with patient and/or surrogate as well as nursing, discussions with consultants, evaluation of patient's response to treatment, examination of patient, obtaining history from patient or surrogate, ordering and performing treatments and interventions, ordering and review of laboratory studies, ordering and review of radiographic studies, pulse oximetry and re-evaluation of patient's condition.   Procedures   MEDICATIONS ORDERED IN ED: Medications  0.9 %  sodium chloride infusion (10 mL/hr  Intravenous New Bag/Given 04/09/23 1301)  potassium chloride 10 mEq in 100 mL IVPB (0 mEq Intravenous Stopped 04/09/23 1421)  potassium chloride SA (KLOR-CON M) CR tablet 40 mEq (40 mEq Oral Given 04/09/23 1311)     IMPRESSION / MDM / ASSESSMENT AND PLAN / ED COURSE  I reviewed the triage vital signs and the nursing notes.  Differential diagnosis includes, but is not limited to, anemia, electrolyte abnormality, dehydration in the setting of recent GI illness, low suspicion acute intra-abdominal process given resolution of symptoms and reassuring abdominal exam  Patient's presentation is most consistent with acute presentation with potential threat to life or bodily function.  48 year old female presenting to the ER for evaluation of fatigue.Labs notable for anemia with hemoglobin of 6.3.  Suspect likely component of dehydration as well.  Potassium low at 2.8.  Given IV and oral repletion.  On reevaluation, patient feels much improved.  She is now tolerating p.o. intake and her vaginal bleeding has resolved.  With this, do think she is stable for discharge with outpatient follow-up.  Strict return precautions provided.  Patient discharged in stable condition.      FINAL CLINICAL IMPRESSION(S) / ED DIAGNOSES   Final diagnoses:  Fatigue, unspecified type  Anemia, unspecified type  Hypokalemia     Rx / DC Orders   ED Discharge Orders     None        Note:  This document was prepared using Dragon voice recognition software and may include unintentional dictation errors.   Trinna Post, MD 04/09/23 [phone]

## 2023-04-09 NOTE — ED Notes (Signed)
See triage note  Presents with some fatigue  States she noticed these sxs' a few days ago  Hx of anemia   Denies any bleeding at present

## 2023-04-09 NOTE — Discharge Instructions (Addendum)
You were seen in the ER today for evaluation of your weakness.  Your hemoglobin was low and we did give you a blood transfusion.  Your potassium level was also low which I suspect is related to your recent vomiting and diarrhea.  Please make sure you are staying hydrated and eating regular meals.  Follow-up with your OB/GYN for further evaluation of your vaginal bleeding.  Return to the ER for new or worsening symptoms.

## 2023-04-09 NOTE — ED Triage Notes (Signed)
Pt via POV from home. Pt c/o fatigue since Thursday. Pt has a hx of iron deficiency anemia. Reports she does take her medications as she should because sometimes she will forget. Pt has had blood transfusions in the past. Pt is A&Ox4 and NAD, ambulatory to triage.

## 2023-04-09 NOTE — ED Notes (Signed)
..  The patient is A&OX4, ambulatory at d/c with independent steady gait, NAD. Pt verbalized understanding of d/c instructions and follow up care.

## 2023-04-10 LAB — BPAM RBC
Blood Product Expiration Date: 202502112359
ISSUE DATE / TIME: 202501261306
Unit Type and Rh: 5100

## 2023-04-10 LAB — TYPE AND SCREEN
ABO/RH(D): B POS
Antibody Screen: NEGATIVE
Unit division: 0

## 2023-04-14 ENCOUNTER — Ambulatory Visit
Admission: RE | Admit: 2023-04-14 | Discharge: 2023-04-14 | Disposition: A | Discharge status: Home / Self Care | Payer: Self-pay | Source: Ambulatory Visit | Attending: Obstetrics and Gynecology | Admitting: Obstetrics and Gynecology

## 2023-04-14 DIAGNOSIS — R599 Enlarged lymph nodes, unspecified: Secondary | ICD-10-CM | POA: Insufficient documentation

## 2023-04-14 MED ORDER — LIDOCAINE-EPINEPHRINE 2 %-1:100000 IJ SOLN
5.0000 mL | Freq: Once | INTRAMUSCULAR | Status: AC
  Administered 2023-04-14: 5 mL
  Filled 2023-04-14: qty 5.1

## 2023-04-14 MED ORDER — LIDOCAINE 1 % OPTIME INJ - NO CHARGE
2.0000 mL | Freq: Once | INTRAMUSCULAR | Status: AC
  Administered 2023-04-14: 2 mL
  Filled 2023-04-14: qty 2

## 2023-04-18 LAB — SURGICAL PATHOLOGY

## 2023-05-05 ENCOUNTER — Ambulatory Visit
Admission: RE | Admit: 2023-05-05 | Discharge: 2023-05-05 | Disposition: A | Discharge status: Home / Self Care | Payer: Self-pay | Source: Ambulatory Visit | Attending: Family Medicine | Admitting: Family Medicine

## 2023-05-05 VITALS — BP 155/89 | Temp 98.7°F | Ht 63.0 in | Wt 212.0 lb

## 2023-05-05 DIAGNOSIS — I10 Essential (primary) hypertension: Secondary | ICD-10-CM

## 2023-05-05 DIAGNOSIS — L03012 Cellulitis of left finger: Secondary | ICD-10-CM

## 2023-05-05 MED ORDER — DOXYCYCLINE HYCLATE 100 MG PO CAPS: 100.0000 mg | ORAL_CAPSULE | Freq: Two times a day (BID) | ORAL | 0 refills | Status: DC

## 2023-05-05 NOTE — Discharge Instructions (Addendum)
Stop by the pharmacy to pick up your prescriptions.  Follow up with your primary care provider or return to urgent care if not improving.   Schedule a new patient appointment to discuss you elevated blood pressure.

## 2023-05-05 NOTE — ED Provider Notes (Signed)
[facility]    CSN: [id number] Arrival date & time: 05/05/23  1801      History   Chief Complaint Chief Complaint  Patient presents with   Finger Injury    Left middle finger    HPI  HPI Julie Tanner is a 48 y.o. female.   Julie Tanner presents for concern for a left middle finger infection. She dropped a box on it Christmas Day. Has bruising under her nail bed.  She continued to get her nails down. Her finger started swelling. On Wednesday, her office manager squeezed it and some pus came out of it.  She doesn't bite her nails any more. No fever. She is left handed.   Doesn't take anything for her blood pressure.   Julie Tanner has otherwise been well and has no other concerns.     Past Medical History:  Diagnosis Date   Anemia     Patient Active Problem List   Diagnosis Date Noted   Pain, dental 03/23/2023   Dental infection 03/23/2023    Past Surgical History:  Procedure Laterality Date   BREAST BIOPSY Bilateral 12/13/2022   coil clip   BREAST BIOPSY Left 12/13/2022   [place] LT BREAST BX W LOC DEV 1ST LESION IMG BX SPEC US GUIDE 12/13/2022 [facility]    OB History   No obstetric history on file.      Home Medications    Prior to Admission medications   Medication Sig Start Date End Date Taking? Authorizing Provider  doxycycline (VIBRAMYCIN) 100 MG capsule Take 1 capsule (100 mg total) by mouth 2 (two) times daily. 05/05/23  Yes Julie Tanner, Seward Meth, DO  ferrous sulfate (FEROSUL) 325 (65 FE) MG tablet Take 325 mg by mouth daily with breakfast. 08/10/22  Yes [provider]  lidocaine (XYLOCAINE) 2 % solution Use as directed 15 mLs in the mouth or throat as needed for mouth pain. Patient not taking: Reported on 03/04/2022 07/21/20   Lucy Chris, PA  norgestimate-ethinyl estradiol (ORTHO-CYCLEN) 0.25-35 MG-MCG tablet Take 1 tablet by mouth daily. 03/04/22   Pilar Jarvis, MD    Family History Family History  Problem Relation Age of Onset    Breast cancer Neg Hx     Social History Social History   Tobacco Use   Smoking status: Former   Smokeless tobacco: Never  Vaping Use   Vaping status: Never Used  Substance Use Topics   Alcohol use: No   Drug use: No     Allergies   Penicillins   Review of Systems Review of Systems: :negative unless otherwise stated in HPI.      Physical Exam Triage Vital Signs ED Triage Vitals  Encounter Vitals Group     BP 05/05/23 1809 (!) 181/91     Systolic BP Percentile --      Diastolic BP Percentile --      Pulse --      Resp --      Temp 05/05/23 1809 98.7 F (37.1 C)     Temp Source 05/05/23 1809 Oral     SpO2 05/05/23 1809 100 %     Weight 05/05/23 1807 212 lb (96.2 kg)     Height 05/05/23 1807 5\' 3"  (1.6 m)     Head Circumference --      Peak Flow --      Pain Score 05/05/23 1807 0     Pain Loc --      Pain Education --      Exclude  from Growth Chart --    No data found.  Updated Vital Signs BP (!) 155/89 (BP Location: Left Arm)   Temp 98.7 F (37.1 C) (Oral)   Ht 5\' 3"  (1.6 m)   Wt 96.2 kg   LMP 05/02/2023 (Approximate)   SpO2 100%   BMI 37.55 kg/m   Visual Acuity Right Eye Distance:   Left Eye Distance:   Bilateral Distance:    Right Eye Near:   Left Eye Near:    Bilateral Near:     Physical Exam GEN: well appearing female in no acute distress  CVS: well perfused, RRR RESP: speaking in full sentences without pause, no respiratory distress, clear  MSK:  Left Hand: Inspection: No obvious deformity b/l. No swelling, erythema or bruising b/l Palpation: no TTP b/l ROM: Full ROM of the digits and wrist b/l. No swelling in PIP, DIP joints b/l. Flexor digitorum profundus and superficialis tendon functions are intact.  PIP joint collateral ligaments are stable  Strength: 5/5 strength in the forearm, wrist and interosseus muscles Neurovascular: NV intact b/l SKIN: 3rd finger on left with periungual hematoma at the nail plate, peeling thin nail  with small amount of purulence from nail bed , acrylic nail on all other fingers except the 3rd finger.      UC Treatments / Results  Labs (all labs ordered are listed, but only abnormal results are displayed) Labs Reviewed - No data to display  EKG   Radiology No results found.   Procedures Procedures (including critical care time)  Medications Ordered in UC Medications - No data to display  Initial Impression / Assessment and Plan / UC Course  I have reviewed the triage vital signs and the nursing notes.  Pertinent labs & imaging results that were available during my care of the patient were reviewed by me and considered in my medical decision making (see chart for details).      Pt is a 49 y.o.  female presents for left middle finger concern after injuring her finger about 2 months ago.   On exam, pt has tenderness with purulence at distal tip concerning for paronychia.   Imaging deferred. Treat with antibiotics as below. Soak area in warn soapy water 3-4 times a day for the next 72 hours. Advised to avoid getting acrylic nails to allow her nails to heal. Patient to gradually return to normal activities, as tolerated and continue ordinary activities within the limits permitted by pain.    Julie Tanner is hypertensive here.  BP 181/91 then after sitting was 155/89. Previous blood pressures from chart review were  elevated. I suspect she has undiagnosed high blood pressure. Recommended she check herblood pressure and re-establish care with her primary care provider.Discussed diet and exercise.    Return and ED precautions given. Understanding voiced. Discussed MDM, treatment plan and plan for follow-up with patient  who agrees with plan.   Final Clinical Impressions(s) / UC Diagnoses   Final diagnoses:  Acute paronychia of finger of left hand  Elevated blood pressure reading with diagnosis of hypertension     Discharge Instructions      Stop by the pharmacy to pick up your  prescriptions.  Follow up with your primary care provider or return to urgent care if not improving.   Schedule a new patient appointment to discuss you elevated blood pressure.      ED Prescriptions     Medication Sig Dispense Auth. Provider   doxycycline (VIBRAMYCIN) 100 MG capsule Take 1  capsule (100 mg total) by mouth 2 (two) times daily. 20 capsule Katha Cabal, DO      PDMP not reviewed this encounter.   Katha Cabal, DO 05/05/23 2004

## 2023-05-05 NOTE — ED Triage Notes (Signed)
Patient reports they her left middle finger is infected.   Patient report that she dropped a box on her finger christmas day.   Has been getting her nails done since then.   Patient reports swelling and redness to that finger.

## 2023-09-01 ENCOUNTER — Observation Stay
Admission: EM | Admit: 2023-09-01 | Discharge: 2023-09-03 | Disposition: A | Discharge status: Home / Self Care | Payer: Self-pay | Attending: Emergency Medicine | Admitting: Emergency Medicine

## 2023-09-01 ENCOUNTER — Other Ambulatory Visit: Payer: Self-pay

## 2023-09-01 ENCOUNTER — Encounter: Payer: Self-pay | Admitting: Emergency Medicine

## 2023-09-01 DIAGNOSIS — N939 Abnormal uterine and vaginal bleeding, unspecified: Secondary | ICD-10-CM | POA: Insufficient documentation

## 2023-09-01 DIAGNOSIS — N938 Other specified abnormal uterine and vaginal bleeding: Secondary | ICD-10-CM | POA: Insufficient documentation

## 2023-09-01 DIAGNOSIS — D649 Anemia, unspecified: Principal | ICD-10-CM | POA: Diagnosis present

## 2023-09-01 LAB — COMPREHENSIVE METABOLIC PANEL WITH GFR
ALT: 8 U/L (ref 0–44)
AST: 14 U/L — ABNORMAL LOW (ref 15–41)
Albumin: 3.3 g/dL — ABNORMAL LOW (ref 3.5–5.0)
Alkaline Phosphatase: 50 U/L (ref 38–126)
Anion gap: 7 (ref 5–15)
BUN: 10 mg/dL (ref 6–20)
CO2: 24 mmol/L (ref 22–32)
Calcium: 8.3 mg/dL — ABNORMAL LOW (ref 8.9–10.3)
Chloride: 107 mmol/L (ref 98–111)
Creatinine, Ser: 0.56 mg/dL (ref 0.44–1.00)
GFR, Estimated: >60 mL/min (ref >60)
Glucose, Bld: 110 mg/dL — ABNORMAL HIGH (ref 70–99)
Potassium: 3.1 mmol/L — ABNORMAL LOW (ref 3.5–5.1)
Sodium: 138 mmol/L (ref 135–145)
Total Bilirubin: 0.3 mg/dL (ref 0.0–1.2)
Total Protein: 6.9 g/dL (ref 6.5–8.1)

## 2023-09-01 LAB — URINALYSIS, ROUTINE W REFLEX MICROSCOPIC
Bacteria, UA: NONE SEEN
Bilirubin Urine: NEGATIVE
Glucose, UA: NEGATIVE mg/dL
Ketones, ur: NEGATIVE mg/dL
Leukocytes,Ua: NEGATIVE
Nitrite: NEGATIVE
Protein, ur: NEGATIVE mg/dL
Specific Gravity, Urine: 1.017 (ref 1.005–1.030)
pH: 5 (ref 5.0–8.0)

## 2023-09-01 LAB — CBC
HCT: 17.4 % — ABNORMAL LOW (ref 36.0–46.0)
Hemoglobin: 4.3 g/dL — CL (ref 12.0–15.0)
MCH: 15 pg — ABNORMAL LOW (ref 26.0–34.0)
MCHC: 24.7 g/dL — ABNORMAL LOW (ref 30.0–36.0)
MCV: 60.8 fL — ABNORMAL LOW (ref 80.0–100.0)
Platelets: 410 10*3/uL — ABNORMAL HIGH (ref 150–400)
RBC: 2.86 MIL/uL — ABNORMAL LOW (ref 3.87–5.11)
RDW: 21.6 % — ABNORMAL HIGH (ref 11.5–15.5)
WBC: 7.2 10*3/uL (ref 4.0–10.5)
nRBC: 0.4 % — ABNORMAL HIGH (ref 0.0–0.2)

## 2023-09-01 LAB — POC URINE PREG, ED: Preg Test, Ur: NEGATIVE

## 2023-09-01 MED ORDER — SODIUM CHLORIDE 0.9 % IV SOLN
10.0000 mL/h | Freq: Once | INTRAVENOUS | Status: AC
  Administered 2023-09-02: 10 mL/h via INTRAVENOUS

## 2023-09-01 NOTE — ED Triage Notes (Addendum)
 Pt arrives c/o feeling as if her iron is low due to increased fatigue over the past 2-3 weeks. Hx of iron deficiency in the past. Pt also reports she has been menstruating x 1 month, hx of fibroids.

## 2023-09-01 NOTE — ED Notes (Signed)
 Ward MD aware hgb 4.3

## 2023-09-02 ENCOUNTER — Observation Stay: Payer: Self-pay

## 2023-09-02 ENCOUNTER — Encounter: Payer: Self-pay | Admitting: Internal Medicine

## 2023-09-02 DIAGNOSIS — D649 Anemia, unspecified: Principal | ICD-10-CM | POA: Diagnosis present

## 2023-09-02 DIAGNOSIS — N938 Other specified abnormal uterine and vaginal bleeding: Secondary | ICD-10-CM | POA: Insufficient documentation

## 2023-09-02 LAB — CBC WITH DIFFERENTIAL/PLATELET
Abs Immature Granulocytes: 0.03 10*3/uL (ref 0.00–0.07)
Basophils Absolute: 0 10*3/uL (ref 0.0–0.1)
Basophils Relative: 0 %
Eosinophils Absolute: 0.1 10*3/uL (ref 0.0–0.5)
Eosinophils Relative: 2 %
HCT: 27 % — ABNORMAL LOW (ref 36.0–46.0)
Hemoglobin: 8 g/dL — ABNORMAL LOW (ref 12.0–15.0)
Immature Granulocytes: 0 %
Lymphocytes Relative: 28 %
Lymphs Abs: 2 10*3/uL (ref 0.7–4.0)
MCH: 20.2 pg — ABNORMAL LOW (ref 26.0–34.0)
MCHC: 29.6 g/dL — ABNORMAL LOW (ref 30.0–36.0)
MCV: 68 fL — ABNORMAL LOW (ref 80.0–100.0)
Monocytes Absolute: 0.5 10*3/uL (ref 0.1–1.0)
Monocytes Relative: 7 %
Neutro Abs: 4.5 10*3/uL (ref 1.7–7.7)
Neutrophils Relative %: 63 %
Platelets: 354 10*3/uL (ref 150–400)
RBC: 3.97 MIL/uL (ref 3.87–5.11)
RDW: 27.1 % — ABNORMAL HIGH (ref 11.5–15.5)
Smear Review: NORMAL
WBC: 7.1 10*3/uL (ref 4.0–10.5)
nRBC: 0.6 % — ABNORMAL HIGH (ref 0.0–0.2)

## 2023-09-02 LAB — CBC
HCT: 24.5 % — ABNORMAL LOW (ref 36.0–46.0)
Hemoglobin: 7.1 g/dL — ABNORMAL LOW (ref 12.0–15.0)
MCH: 19.3 pg — ABNORMAL LOW (ref 26.0–34.0)
MCHC: 29 g/dL — ABNORMAL LOW (ref 30.0–36.0)
MCV: 66.6 fL — ABNORMAL LOW (ref 80.0–100.0)
Platelets: 356 10*3/uL (ref 150–400)
RBC: 3.68 MIL/uL — ABNORMAL LOW (ref 3.87–5.11)
RDW: 26.5 % — ABNORMAL HIGH (ref 11.5–15.5)
WBC: 9.2 10*3/uL (ref 4.0–10.5)
nRBC: 0.3 % — ABNORMAL HIGH (ref 0.0–0.2)

## 2023-09-02 LAB — HIV ANTIBODY (ROUTINE TESTING W REFLEX): HIV Screen 4th Generation wRfx: NONREACTIVE

## 2023-09-02 LAB — PREPARE RBC (CROSSMATCH)

## 2023-09-02 LAB — PROTIME-INR
INR: 1.1 (ref 0.8–1.2)
Prothrombin Time: 14 s (ref 11.4–15.2)

## 2023-09-02 MED ORDER — ONDANSETRON HCL 4 MG PO TABS
4.0000 mg | ORAL_TABLET | Freq: Four times a day (QID) | ORAL | Status: DC | PRN
Start: 2023-09-02 — End: 2023-09-03

## 2023-09-02 MED ORDER — ACETAMINOPHEN 650 MG RE SUPP: 650.0000 mg | Freq: Four times a day (QID) | RECTAL | Status: DC | PRN

## 2023-09-02 MED ORDER — HYDROCODONE-ACETAMINOPHEN 5-325 MG PO TABS: 1.0000 | ORAL_TABLET | ORAL | Status: DC | PRN

## 2023-09-02 MED ORDER — FERROUS SULFATE 325 (65 FE) MG PO TABS
325.0000 mg | ORAL_TABLET | Freq: Every day | ORAL | Status: DC
  Administered 2023-09-02 – 2023-09-03 (×2): 325 mg via ORAL
  Filled 2023-09-02 (×2): qty 1

## 2023-09-02 MED ORDER — MEDROXYPROGESTERONE ACETATE 10 MG PO TABS
10.0000 mg | ORAL_TABLET | Freq: Three times a day (TID) | ORAL | Status: DC
  Administered 2023-09-02 – 2023-09-03 (×4): 10 mg via ORAL
  Filled 2023-09-02 (×4): qty 1

## 2023-09-02 MED ORDER — ACETAMINOPHEN 325 MG PO TABS
650.0000 mg | ORAL_TABLET | Freq: Four times a day (QID) | ORAL | Status: DC | PRN
Start: 2023-09-02 — End: 2023-09-03

## 2023-09-02 MED ORDER — ONDANSETRON HCL 4 MG/2ML IJ SOLN: 4.0000 mg | Freq: Four times a day (QID) | INTRAMUSCULAR | Status: DC | PRN

## 2023-09-02 NOTE — Plan of Care (Signed)

## 2023-09-02 NOTE — TOC Initial Note (Signed)
 Transition of Care [facility]) - Initial/Assessment Note    Patient Details  Name: Julie Tanner MRN: [id number] Date of Birth: 1975-06-03  Transition of Care [facility]) CM/SW Contact:    Quintella Suzen Jansky, RN Phone Number: 09/02/2023, 9:41 AM  Clinical Narrative:                  Patient lives with daughter. Independent with ADLs. No PCP listed, added PCP list to AVS. No other discharge needs identified by TOC at this time. If TOC needs identified [facility] consult.       Patient Goals and CMS Choice            Expected Discharge Plan and Services                                              Prior Living Arrangements/Services                       Activities of Daily Living   ADL Screening (condition at time of admission) Independently performs ADLs?: Yes (appropriate for developmental age) Is the patient deaf or have difficulty hearing?: No Does the patient have difficulty seeing, even when wearing glasses/contacts?: No Does the patient have difficulty concentrating, remembering, or making decisions?: No  Permission Sought/Granted                  Emotional Assessment              Admission diagnosis:  Symptomatic anemia [D64.9] Patient Active Problem List   Diagnosis Date Noted   Symptomatic anemia 09/02/2023   Dysfunctional uterine bleeding 09/02/2023   Pain, dental 03/23/2023   Dental infection 03/23/2023   PCP:  Pcp, No Pharmacy:   [id number] - [place], [place] - [address] [place] [address] Phone: [phone] Fax: [phone]     Social Drivers of Health (SDOH) Social History: SDOH Screenings   Food Insecurity: No Food Insecurity (09/02/2023)  Housing: High Risk (09/02/2023)  Transportation Needs: No Transportation Needs (09/02/2023)  Utilities: Not At Risk (09/02/2023)  Social Connections: Socially Isolated (09/02/2023)  Tobacco Use: Low Risk  (05/29/2023)   Received from [facility]   Recent Concern: Tobacco Use - Medium Risk (05/05/2023)   SDOH Interventions:     Readmission Risk Interventions     No data to display

## 2023-09-02 NOTE — ED Notes (Signed)
Duncan MD at bedside 

## 2023-09-02 NOTE — Assessment & Plan Note (Signed)
 Dysfunctional uterine bleeding Continue transfusion of 2 units PRBCs with H&H thereafter to decide on third unit Resume ferrous sulfate  Consider GYN consult in the a.m. versus outpatient referral

## 2023-09-02 NOTE — Progress Notes (Signed)
 Progress Note   Patient: Julie Tanner FMW:[id number] DOB: 1975/10/29 DOA: 09/01/2023     0 DOS: the patient was seen and examined on 09/02/2023   Brief hospital course: From HPI Julie Tanner is a 48 y.o. female with medical history significant for IDA secondary to heavy menstrual bleeding being admitted for symptomatic anemia with hemoglobin of 4.3.  Has been having vaginal bleed for the past month and started feeling very fatigued and tired over the past 2 to 3 weeks similar to when her iron has been low in the past.  She has associated dyspnea on exertion and recently started seeing some swelling in her feet.  She denied chest pain or leg pain.  States she saw gynecologist and [place] in May and they spoke to her about her options but she is not due to see them for another month or 2. In the ED temp 99.2, slightly elevated BP 99.2 with otherwise normal vitals.  Labs notable for hemoglobin of 4.3, with most recent 6.3 about 4 months prior.  Potassium 3.1. Patient started on 1 of 3 units PRBCs.  Admission requested.      Assessment and Plan: * Symptomatic anemia Dysfunctional uterine bleeding Patient has received 2 units of blood transfusion 3 units of blood transfusion pending Resume ferrous sulfate  I have discussed with gynecology Dr. Leonce We will give patient 20 mg of Provera  3 times daily for total of 7 days and to extend as needed Patient to follow-up with Dr. Leonce as an outpatient Continue to monitor CBC closely Obtain pelvic ultrasound  DVT prophylaxis: SCD   Consults: none   Advance Care Planning: full code   Family Communication: none   Disposition Plan: Back to previous home environment  Subjective:  Patient still continues to complain of vaginal bleeding Denies nausea vomiting abdominal pain chest pain cough  Physical Exam:  Vitals and nursing note reviewed.  Constitutional:      General: She is not in acute distress. HENT:     Head:  Normocephalic and atraumatic.    Cardiovascular:     Rate and Rhythm: Normal rate and regular rhythm.     Heart sounds: Normal heart sounds.  Pulmonary:     Effort: Pulmonary effort is normal.     Breath sounds: Normal breath sounds.  Abdominal:     Palpations: Abdomen is soft.     Tenderness: There is no abdominal tenderness.    Neurological:     Mental Status: Mental status is at baseline.       Vitals:   09/02/23 0738 09/02/23 1310 09/02/23 1345 09/02/23 1601  BP: (!) 154/89 (!) 153/84 (!) 163/73 137/88  Pulse: 78 72  74  Resp: 16 15 18 18   Temp: 98.7 F (37.1 C) 98 F (36.7 C) 98 F (36.7 C) 98.4 F (36.9 C)  TempSrc:  Oral Oral   SpO2: 100% 100% 100% 100%  Weight:      Height:        Data Reviewed:     Latest Ref Rng & Units 09/02/2023    8:45 AM 09/01/2023   10:13 PM 04/09/2023   10:56 AM  CBC  WBC 4.0 - 10.5 K/uL 9.2  7.2  4.4   Hemoglobin 12.0 - 15.0 g/dL 7.1  4.3  6.3   Hematocrit 36.0 - 46.0 % 24.5  17.4  23.1   Platelets 150 - 400 K/uL 356  410  470        Latest Ref Rng & Units  09/01/2023   10:13 PM 04/09/2023   10:56 AM 03/10/2022    1:02 PM  BMP  Glucose 70 - 99 mg/dL 889  895  95   BUN 6 - 20 mg/dL 10  11  7    Creatinine 0.44 - 1.00 mg/dL 9.43  9.35  9.52   Sodium 135 - 145 mmol/L 138  136  138   Potassium 3.5 - 5.1 mmol/L 3.1  2.8  3.5   Chloride 98 - 111 mmol/L 107  103  107   CO2 22 - 32 mmol/L 24  24  23    Calcium  8.9 - 10.3 mg/dL 8.3  8.3  8.5      Family Communication: None at bedside  Disposition: Home when medically stable  Time spent: 53 minutes  Author: Drue ONEIDA Potter, MD 09/02/2023 4:17 PM  For on call review [email or web address].

## 2023-09-02 NOTE — Discharge Instructions (Signed)
 Some PCP options in [place] area- not a comprehensive list  [facility]- [phone] [facility]- [phone] [facility]- [phone] [facility]- [phone] [facility]- [phone] [facility]- [phone]  or [facility] Physician Referral Line [phone]

## 2023-09-02 NOTE — H&P (Signed)
 History and Physical    Patient: Julie Tanner FMW:[id number] DOB: 02/07/1976 DOA: 09/01/2023 DOS: the patient was seen and examined on 09/02/2023 PCP: Pcp, No  Patient coming from: Home  Chief Complaint:  Chief Complaint  Patient presents with   Fatigue    HPI: Julie Tanner is a 48 y.o. female with medical history significant for IDA secondary to heavy menstrual bleeding being admitted for symptomatic anemia with hemoglobin of 4.3.  Has been having vaginal bleed for the past month and started feeling very fatigued and tired over the past 2 to 3 weeks similar to when her iron has been low in the past.  She has associated dyspnea on exertion and recently started seeing some swelling in her feet.  She denied chest pain or leg pain.  States she saw gynecologist and [place] in May and they spoke to her about her options but she is not due to see them for another month or 2. In the ED temp 99.2, slightly elevated BP 99.2 with otherwise normal vitals.  Labs notable for hemoglobin of 4.3, with most recent 6.3 about 4 months prior.  Potassium 3.1. Patient started on 1 of 3 units PRBCs.  Admission requested.     Review of Systems: As mentioned in the history of present illness. All other systems reviewed and are negative.  Past Medical History:  Diagnosis Date   Anemia    Past Surgical History:  Procedure Laterality Date   BREAST BIOPSY Bilateral 12/13/2022   coil clip   BREAST BIOPSY Left 12/13/2022   US  LT BREAST BX W LOC DEV 1ST LESION IMG BX SPEC US  GUIDE 12/13/2022 [facility]   Social History:  reports that she has quit smoking. She has never used smokeless tobacco. She reports that she does not drink alcohol and does not use drugs.  Allergies  Allergen Reactions   Penicillins Hives    Family History  Problem Relation Age of Onset   Breast cancer Neg Hx     Prior to Admission medications   Medication Sig Start Date End Date Taking? Authorizing Provider   doxycycline  (VIBRAMYCIN ) 100 MG capsule Take 1 capsule (100 mg total) by mouth 2 (two) times daily. Patient not taking: Reported on 09/02/2023 05/05/23   Brimage, Vondra, DO  ferrous sulfate  (FEROSUL) 325 (65 FE) MG tablet Take 325 mg by mouth daily with breakfast. Patient not taking: Reported on 09/02/2023 08/10/22   [provider]  lidocaine  (XYLOCAINE ) 2 % solution Use as directed 15 mLs in the mouth or throat as needed for mouth pain. Patient not taking: Reported on 03/04/2022 07/21/20   Rodgers, Caitlin J, PA  norgestimate -ethinyl estradiol  (ORTHO-CYCLEN) 0.25-35 MG-MCG tablet Take 1 tablet by mouth daily. Patient not taking: Reported on 09/02/2023 03/04/22   Cyrena Mylar, MD    Physical Exam: Vitals:   09/01/23 2211  BP: (!) 164/88  Pulse: 88  Resp: 18  Temp: 99.2 F (37.3 C)  TempSrc: Oral  SpO2: 100%  Weight: 99.8 kg  Height: 5' 3 (1.6 m)   Physical Exam Vitals and nursing note reviewed.  Constitutional:      General: She is not in acute distress. HENT:     Head: Normocephalic and atraumatic.   Cardiovascular:     Rate and Rhythm: Normal rate and regular rhythm.     Heart sounds: Normal heart sounds.  Pulmonary:     Effort: Pulmonary effort is normal.     Breath sounds: Normal breath sounds.  Abdominal:  Palpations: Abdomen is soft.     Tenderness: There is no abdominal tenderness.   Neurological:     Mental Status: Mental status is at baseline.     Labs on Admission: I have personally reviewed following labs and imaging studies  CBC: Recent Labs  Lab 09/01/23 2213  WBC 7.2  HGB 4.3*  HCT 17.4*  MCV 60.8*  PLT 410*   Basic Metabolic Panel: Recent Labs  Lab 09/01/23 2213  NA 138  K 3.1*  CL 107  CO2 24  GLUCOSE 110*  BUN 10  CREATININE 0.56  CALCIUM  8.3*   GFR: Estimated Creatinine Clearance: 96.9 mL/min (by C-G formula based on SCr of 0.56 mg/dL). Liver Function Tests: Recent Labs  Lab 09/01/23 2213  AST 14*  ALT 8   ALKPHOS 50  BILITOT 0.3  PROT 6.9  ALBUMIN 3.3*   No results for input(s): LIPASE, AMYLASE in the last 168 hours. No results for input(s): AMMONIA in the last 168 hours. Coagulation Profile: Recent Labs  Lab 09/01/23 2334  INR 1.1   Cardiac Enzymes: No results for input(s): CKTOTAL, CKMB, CKMBINDEX, TROPONINI in the last 168 hours. BNP (last 3 results) No results for input(s): PROBNP in the last 8760 hours. HbA1C: No results for input(s): HGBA1C in the last 72 hours. CBG: No results for input(s): GLUCAP in the last 168 hours. Lipid Profile: No results for input(s): CHOL, HDL, LDLCALC, TRIG, CHOLHDL, LDLDIRECT in the last 72 hours. Thyroid Function Tests: No results for input(s): TSH, T4TOTAL, FREET4, T3FREE, THYROIDAB in the last 72 hours. Anemia Panel: No results for input(s): VITAMINB12, FOLATE, FERRITIN, TIBC, IRON, RETICCTPCT in the last 72 hours. Urine analysis:    Component Value Date/Time   COLORURINE YELLOW (A) 09/01/2023 2213   APPEARANCEUR CLEAR (A) 09/01/2023 2213   LABSPEC 1.017 09/01/2023 2213   PHURINE 5.0 09/01/2023 2213   GLUCOSEU NEGATIVE 09/01/2023 2213   HGBUR MODERATE (A) 09/01/2023 2213   BILIRUBINUR NEGATIVE 09/01/2023 2213   KETONESUR NEGATIVE 09/01/2023 2213   PROTEINUR NEGATIVE 09/01/2023 2213   NITRITE NEGATIVE 09/01/2023 2213   LEUKOCYTESUR NEGATIVE 09/01/2023 2213    Radiological Exams on Admission: No results found. Data Reviewed for HPI: Relevant notes from primary care and specialist visits, past discharge summaries as available in EHR, including Care Everywhere. Prior diagnostic testing as pertinent to current admission diagnoses Updated medications and problem lists for reconciliation ED course, including vitals, labs, imaging, treatment and response to treatment Triage notes, nursing and pharmacy notes and ED provider's notes Notable results as noted above in  HPI      Assessment and Plan: * Symptomatic anemia Dysfunctional uterine bleeding Continue transfusion of 2 units PRBCs with H&H thereafter to decide on third unit Resume ferrous sulfate  Consider GYN consult in the a.m. versus outpatient referral         DVT prophylaxis: SCD  Consults: none  Advance Care Planning: full code  Family Communication: none  Disposition Plan: Back to previous home environment  Severity of Illness: The appropriate patient status for this patient is OBSERVATION. Observation status is judged to be reasonable and necessary in order to provide the required intensity of service to ensure the patient's safety. The patient's presenting symptoms, physical exam findings, and initial radiographic and laboratory data in the context of their medical condition is felt to place them at decreased risk for further clinical deterioration. Furthermore, it is anticipated that the patient will be medically stable for discharge from the hospital within 2 midnights of admission.  Author: Delayne LULLA Solian, MD 09/02/2023 12:32 AM  For on call review [email or web address].

## 2023-09-02 NOTE — ED Provider Notes (Signed)
 [facility] Provider Note    Event Date/Time   First MD Initiated Contact with Patient 09/01/23 2310     (approximate)   History   Fatigue   HPI  Julie Tanner is a 48 y.o. female with history of iron deficiency anemia, heavy menstrual cycles who presents to the emergency department with complaints of 1 to 2 weeks of feeling very tired, short of breath with exertion, lightheaded.  No chest pain.  She states she has been on her period for about a month although it is improving.  She has had previous iron and blood transfusions and felt like her iron or blood counts were low today.  No bloody stools, melena.  Not on blood thinners.   History provided by patient.    Past Medical History:  Diagnosis Date   Anemia     Past Surgical History:  Procedure Laterality Date   BREAST BIOPSY Bilateral 12/13/2022   coil clip   BREAST BIOPSY Left 12/13/2022   US  LT BREAST BX W LOC DEV 1ST LESION IMG BX SPEC US  GUIDE 12/13/2022 [facility]    MEDICATIONS:  Prior to Admission medications   Medication Sig Start Date End Date Taking? Authorizing Provider  doxycycline  (VIBRAMYCIN ) 100 MG capsule Take 1 capsule (100 mg total) by mouth 2 (two) times daily. 05/05/23   Brimage, Vondra, DO  ferrous sulfate  (FEROSUL) 325 (65 FE) MG tablet Take 325 mg by mouth daily with breakfast. 08/10/22   [provider]  lidocaine  (XYLOCAINE ) 2 % solution Use as directed 15 mLs in the mouth or throat as needed for mouth pain. Patient not taking: Reported on 03/04/2022 07/21/20   Rodgers, Caitlin J, PA  norgestimate -ethinyl estradiol  (ORTHO-CYCLEN) 0.25-35 MG-MCG tablet Take 1 tablet by mouth daily. 03/04/22   Cyrena Mylar, MD    Physical Exam   Triage Vital Signs: ED Triage Vitals  Encounter Vitals Group     BP 09/01/23 2211 (!) 164/88     Girls Systolic BP Percentile --      Girls Diastolic BP Percentile --      Boys Systolic BP Percentile --      Boys Diastolic BP  Percentile --      Pulse Rate 09/01/23 2211 88     Resp 09/01/23 2211 18     Temp 09/01/23 2211 99.2 F (37.3 C)     Temp Source 09/01/23 2211 Oral     SpO2 09/01/23 2211 100 %     Weight 09/01/23 2211 220 lb (99.8 kg)     Height 09/01/23 2211 5' 3 (1.6 m)     Head Circumference --      Peak Flow --      Pain Score 09/01/23 2231 0     Pain Loc --      Pain Education --      Exclude from Growth Chart --     Most recent vital signs: Vitals:   09/02/23 0650 09/02/23 0738  BP: (!) 143/80 (!) 154/89  Pulse: 72 78  Resp: 20 16  Temp: 97.9 F (36.6 C) 98.7 F (37.1 C)  SpO2: 100% 100%    CONSTITUTIONAL: Alert, responds appropriately to questions. Well-appearing; well-nourished HEAD: Normocephalic, atraumatic EYES: Conjunctivae clear, pupils appear equal, sclera nonicteric, pale conjunctiva ENT: normal nose; moist mucous membranes NECK: Supple, normal ROM CARD: RRR; S1 and S2 appreciated RESP: Normal chest excursion without splinting or tachypnea; breath sounds clear and equal bilaterally; no wheezes, no rhonchi, no rales, no hypoxia  or respiratory distress, speaking full sentences ABD/GI: Non-distended; soft, non-tender, no rebound, no guarding, no peritoneal signs BACK: The back appears normal EXT: Normal ROM in all joints; no deformity noted, no edema SKIN: Normal color for age and race; warm; no rash on exposed skin NEURO: Moves all extremities equally, normal speech PSYCH: The patient's mood and manner are appropriate.   ED Results / Procedures / Treatments   LABS: (all labs ordered are listed, but only abnormal results are displayed) Labs Reviewed  COMPREHENSIVE METABOLIC PANEL WITH GFR - Abnormal; Notable for the following components:      Result Value   Potassium 3.1 (*)    Glucose, Bld 110 (*)    Calcium  8.3 (*)    Albumin 3.3 (*)    AST 14 (*)    All other components within normal limits  CBC - Abnormal; Notable for the following components:   RBC 2.86 (*)     Hemoglobin 4.3 (*)    HCT 17.4 (*)    MCV 60.8 (*)    MCH 15.0 (*)    MCHC 24.7 (*)    RDW 21.6 (*)    Platelets 410 (*)    nRBC 0.4 (*)    All other components within normal limits  URINALYSIS, ROUTINE W REFLEX MICROSCOPIC - Abnormal; Notable for the following components:   Color, Urine YELLOW (*)    APPearance CLEAR (*)    Hgb urine dipstick MODERATE (*)    All other components within normal limits  PROTIME-INR  HIV ANTIBODY (ROUTINE TESTING W REFLEX)  CBC  POC URINE PREG, ED  TYPE AND SCREEN  PREPARE RBC (CROSSMATCH)     EKG:   RADIOLOGY: My personal review and interpretation of imaging:    I have personally reviewed all radiology reports.   No results found.   PROCEDURES:  Critical Care performed: Yes, see critical care procedure note(s)   CRITICAL CARE Performed by: Josette Sink   Total critical care time: 30 minutes  Critical care time was exclusive of separately billable procedures and treating other patients.  Critical care was necessary to treat or prevent imminent or life-threatening deterioration.  Critical care was time spent personally by me on the following activities: development of treatment plan with patient and/or surrogate as well as nursing, discussions with consultants, evaluation of patient's response to treatment, examination of patient, obtaining history from patient or surrogate, ordering and performing treatments and interventions, ordering and review of laboratory studies, ordering and review of radiographic studies, pulse oximetry and re-evaluation of patient's condition.   SABRA1-3 Lead EKG Interpretation  Performed by: Marvens Hollars, Josette SAILOR, DO Authorized by: Samhita Kretsch, Josette SAILOR, DO     Interpretation: normal     ECG rate:  78   ECG rate assessment: normal     Rhythm: sinus rhythm     Ectopy: none     Conduction: normal       IMPRESSION / MDM / ASSESSMENT AND PLAN / ED COURSE  I reviewed the triage vital signs and the nursing  notes.    Patient here with iron deficiency anemia with fatigue, shortness of breath with exertion, lightheadedness.  The patient is on the cardiac monitor to evaluate for evidence of arrhythmia and/or significant heart rate changes.   DIFFERENTIAL DIAGNOSIS (includes but not limited to):   Anemia, iron deficiency, electrolyte derangement, thyroid dysfunction, less likely ACS, PE, pneumonia, CHF   Patient's presentation is most consistent with acute presentation with potential threat to life or bodily function.  PLAN: Labs show hemoglobin of 4.3.  She is hemodynamically stable.  She has a reactive thrombocytosis.  Will give 3 units of packed red blood cells.  She has been admitted before and transfused before.  Likely due to acute blood loss secondary to heavy menstrual cycles.  Reports her vaginal bleeding has significantly improved and has almost stopped.  Abdominal exam is benign.  I do not think at this time there is any indication for IV estrogen, TXA.  Will check INR, type and screen.  Will discuss with hospitalist for admission.   MEDICATIONS GIVEN IN ED: Medications  ferrous sulfate  tablet 325 mg (325 mg Oral Given 09/02/23 0132)  acetaminophen (TYLENOL) tablet 650 mg (has no administration in time range)    Or  acetaminophen (TYLENOL) suppository 650 mg (has no administration in time range)  ondansetron (ZOFRAN) tablet 4 mg (has no administration in time range)    Or  ondansetron (ZOFRAN) injection 4 mg (has no administration in time range)  HYDROcodone-acetaminophen (NORCO/VICODIN) 5-325 MG per tablet 1-2 tablet (has no administration in time range)  0.9 %  sodium chloride  infusion (0 mL/hr Intravenous Stopped 09/02/23 0125)     ED COURSE: INR normal.  Patient remains hemodynamically stable.   CONSULTS:  Consulted and discussed patient's case with hospitalist, Dr. Cleatus.  I have recommended admission and consulting physician agrees and will place admission orders.   Patient (and family if present) agree with this plan.   I reviewed all nursing notes, vitals, pertinent previous records.  All labs, EKGs, imaging ordered have been independently reviewed and interpreted by myself.    OUTSIDE RECORDS REVIEWED: Reviewed OB/GYN note with Dr. Leonce for menorrhagia on 03/09/2022.  At that time, patient deferred any treatment or surgical management until she could qualify for Medicaid.       FINAL CLINICAL IMPRESSION(S) / ED DIAGNOSES   Final diagnoses:  Symptomatic anemia     Rx / DC Orders   ED Discharge Orders     None        Note:  This document was prepared using Dragon voice recognition software and may include unintentional dictation errors.   Albertine Lafoy, Josette SAILOR, DO 09/02/23 [phone]

## 2023-09-03 LAB — BPAM RBC
Blood Product Expiration Date: 202507142359
Blood Product Expiration Date: 202507202359
Blood Product Expiration Date: 202507202359
ISSUE DATE / TIME: 202506210113
ISSUE DATE / TIME: 202506210407
ISSUE DATE / TIME: 202506211319
Unit Type and Rh: 7300
Unit Type and Rh: 7300
Unit Type and Rh: 7300

## 2023-09-03 LAB — BASIC METABOLIC PANEL WITH GFR
Anion gap: 6 (ref 5–15)
BUN: 9 mg/dL (ref 6–20)
CO2: 23 mmol/L (ref 22–32)
Calcium: 8.2 mg/dL — ABNORMAL LOW (ref 8.9–10.3)
Chloride: 110 mmol/L (ref 98–111)
Creatinine, Ser: 0.54 mg/dL (ref 0.44–1.00)
GFR, Estimated: >60 mL/min (ref >60)
Glucose, Bld: 89 mg/dL (ref 70–99)
Potassium: 3.5 mmol/L (ref 3.5–5.1)
Sodium: 139 mmol/L (ref 135–145)

## 2023-09-03 LAB — TYPE AND SCREEN
ABO/RH(D): B POS
Antibody Screen: NEGATIVE
Unit division: 0
Unit division: 0
Unit division: 0

## 2023-09-03 LAB — CBC WITH DIFFERENTIAL/PLATELET
Abs Immature Granulocytes: 0.04 10*3/uL (ref 0.00–0.07)
Basophils Absolute: 0 10*3/uL (ref 0.0–0.1)
Basophils Relative: 0 %
Eosinophils Absolute: 0.2 10*3/uL (ref 0.0–0.5)
Eosinophils Relative: 3 %
HCT: 27.9 % — ABNORMAL LOW (ref 36.0–46.0)
Hemoglobin: 8.3 g/dL — ABNORMAL LOW (ref 12.0–15.0)
Immature Granulocytes: 1 %
Lymphocytes Relative: 33 %
Lymphs Abs: 2.1 10*3/uL (ref 0.7–4.0)
MCH: 20.2 pg — ABNORMAL LOW (ref 26.0–34.0)
MCHC: 29.7 g/dL — ABNORMAL LOW (ref 30.0–36.0)
MCV: 67.9 fL — ABNORMAL LOW (ref 80.0–100.0)
Monocytes Absolute: 0.6 10*3/uL (ref 0.1–1.0)
Monocytes Relative: 9 %
Neutro Abs: 3.4 10*3/uL (ref 1.7–7.7)
Neutrophils Relative %: 54 %
Platelets: 346 10*3/uL (ref 150–400)
RBC: 4.11 MIL/uL (ref 3.87–5.11)
RDW: 27.7 % — ABNORMAL HIGH (ref 11.5–15.5)
Smear Review: NORMAL
WBC: 6.3 10*3/uL (ref 4.0–10.5)
nRBC: 0.6 % — ABNORMAL HIGH (ref 0.0–0.2)

## 2023-09-03 MED ORDER — FERROUS SULFATE 325 (65 FE) MG PO TABS: 325.0000 mg | ORAL_TABLET | Freq: Every day | ORAL | 3 refills | Status: AC

## 2023-09-03 MED ORDER — MEDROXYPROGESTERONE ACETATE 10 MG PO TABS: 20.0000 mg | ORAL_TABLET | Freq: Three times a day (TID) | ORAL | 0 refills | Status: AC

## 2023-09-03 NOTE — Discharge Summary (Signed)
 Physician Discharge Summary   Patient: Julie Tanner MRN: [id number] DOB: 08-05-1975  Admit date:     09/01/2023  Discharge date: 09/03/23  Discharge Physician: Drue ONEIDA Potter   PCP: Pcp, No   Recommendations at discharge:  Follow-up with gynecologist  Discharge Diagnoses: Symptomatic anemia Dysfunctional uterine bleeding  Hospital Course: Julie Tanner is a 48 y.o. female with medical history significant for IDA secondary to heavy menstrual bleeding being admitted for symptomatic anemia with hemoglobin of 4.3.  Has been having vaginal bleed for the past month and started feeling very fatigued and tired over the past 2 to 3 weeks similar to when her iron has been low in the past.  She has associated dyspnea on exertion and recently started seeing some swelling in her feet.  She denied chest pain or leg pain.  States she saw gynecologist and [facility] in May and they spoke to her about her options but she is not due to see them for another month or 2.  Patient's bleeding currently stopped after receiving Provera  as recommended by gynecologist Dr. Leonce.  She received 3 units of blood transfusion with appropriate increase in hemoglobin.  Patient will follow-up with Dr. Leonce as an outpatient    Consultants: Gynecologist Procedures performed: None Disposition: Home Diet recommendation:  Cardiac diet DISCHARGE MEDICATION: Allergies as of 09/03/2023       Reactions   Penicillins Hives        Medication List     STOP taking these medications    doxycycline  100 MG capsule Commonly known as: VIBRAMYCIN    lidocaine  2 % solution Commonly known as: XYLOCAINE    norgestimate -ethinyl estradiol  0.25-35 MG-MCG tablet Commonly known as: ORTHO-CYCLEN       TAKE these medications    ferrous sulfate  325 (65 FE) MG tablet Commonly known as: FeroSul Take 1 tablet (325 mg total) by mouth daily with breakfast.   medroxyPROGESTERone  10 MG tablet Commonly known as:  PROVERA  Take 2 tablets (20 mg total) by mouth 3 (three) times daily for 7 days.        Discharge Exam:  Filed Weights   09/01/23 2211  Weight: 99.8 kg   General: She is not in acute distress. HENT:     Head: Normocephalic and atraumatic.    Cardiovascular:     Rate and Rhythm: Normal rate and regular rhythm.     Heart sounds: Normal heart sounds.  Pulmonary:     Effort: Pulmonary effort is normal.     Breath sounds: Normal breath sounds.  Abdominal:     Palpations: Abdomen is soft.     Tenderness: There is no abdominal tenderness.    Neurological:     Mental Status: Mental status is at baseline.     Condition at discharge: good  The results of significant diagnostics from this hospitalization (including imaging, microbiology, ancillary and laboratory) are listed below for reference.   Imaging Studies: US  PELVIS (TRANSABDOMINAL ONLY) Result Date: 09/02/2023 CLINICAL DATA:  Vaginal bleeding for 10 days.  Anemia. EXAM: TRANSABDOMINAL ULTRASOUND OF PELVIS TECHNIQUE: Transabdominal ultrasound examination of the pelvis was performed including evaluation of the uterus, ovaries, adnexal regions, and pelvic cul-de-sac. COMPARISON:  None Available. FINDINGS: Uterus Measurements: 14.5 x 9.8 x 11.2 cm = volume: 836.6 mL. Multiple fibroids are identified measuring 4.1 x 3.3 x 3.9 cm, 3.6 x 3.3 x 3.7 cm, and 7.7 x 6.9 x 9.9 cm. Endometrium Thickness: 5 mm. A small amount of fluid is present in the endometrial cavity Right ovary  Measurements: 2.7 x 2.1 x 2.4 cm = volume: 6.9 mL. Normal appearance/no adnexal mass. Left ovary Measurements: 2.7 x 1.9 x 2.3 cm = volume: 6.2 mL. Normal appearance/no adnexal mass. Other findings:  No abnormal free fluid. IMPRESSION: Enlarged fibroid uterus. Electronically Signed   By: Leita Birmingham M.D.   On: 09/02/2023 18:49    Microbiology: Results for orders placed or performed during the hospital encounter of 03/10/22  Resp panel by RT-PCR (RSV, Flu A&B,  Covid) Anterior Nasal Swab     Status: None   Collection Time: 03/10/22  3:39 PM   Specimen: Anterior Nasal Swab  Result Value Ref Range Status   SARS Coronavirus 2 by RT PCR NEGATIVE NEGATIVE Final    Comment: (NOTE) SARS-CoV-2 target nucleic acids are NOT DETECTED.  The SARS-CoV-2 RNA is generally detectable in upper respiratory specimens during the acute phase of infection. The lowest concentration of SARS-CoV-2 viral copies this assay can detect is 138 copies/mL. A negative result does not preclude SARS-Cov-2 infection and should not be used as the sole basis for treatment or other patient management decisions. A negative result may occur with  improper specimen collection/handling, submission of specimen other than nasopharyngeal swab, presence of viral mutation(s) within the areas targeted by this assay, and inadequate number of viral copies(<138 copies/mL). A negative result must be combined with clinical observations, patient history, and epidemiological information. The expected result is Negative.  Fact Sheet for Patients:  BloggerCourse.com  Fact Sheet for Healthcare Providers:  SeriousBroker.it  This test is no t yet approved or cleared by the [place]  FDA and  has been authorized for detection and/or diagnosis of SARS-CoV-2 by FDA under an Emergency Use Authorization (EUA). This EUA will remain  in effect (meaning this test can be used) for the duration of the COVID-19 declaration under Section 564(b)(1) of the Act, 21 U.S.C.section 360bbb-3(b)(1), unless the authorization is terminated  or revoked sooner.       Influenza A by PCR NEGATIVE NEGATIVE Final   Influenza B by PCR NEGATIVE NEGATIVE Final    Comment: (NOTE) The Xpert Xpress SARS-CoV-2/FLU/RSV plus assay is intended as an aid in the diagnosis of influenza from Nasopharyngeal swab specimens and should not be used as a sole basis for treatment. Nasal  washings and aspirates are unacceptable for Xpert Xpress SARS-CoV-2/FLU/RSV testing.  Fact Sheet for Patients: BloggerCourse.com  Fact Sheet for Healthcare Providers: SeriousBroker.it  This test is not yet approved or cleared by the [place]  FDA and has been authorized for detection and/or diagnosis of SARS-CoV-2 by FDA under an Emergency Use Authorization (EUA). This EUA will remain in effect (meaning this test can be used) for the duration of the COVID-19 declaration under Section 564(b)(1) of the Act, 21 U.S.C. section 360bbb-3(b)(1), unless the authorization is terminated or revoked.     Resp Syncytial Virus by PCR NEGATIVE NEGATIVE Final    Comment: (NOTE) Fact Sheet for Patients: BloggerCourse.com  Fact Sheet for Healthcare Providers: SeriousBroker.it  This test is not yet approved or cleared by the [place]  FDA and has been authorized for detection and/or diagnosis of SARS-CoV-2 by FDA under an Emergency Use Authorization (EUA). This EUA will remain in effect (meaning this test can be used) for the duration of the COVID-19 declaration under Section 564(b)(1) of the Act, 21 U.S.C. section 360bbb-3(b)(1), unless the authorization is terminated or revoked.  Performed at [facility], [address] [place], [place] [address]     Labs: CBC: Recent Labs  Lab 09/01/23 2213 09/02/23 0845 09/02/23 1650 09/03/23 0545  WBC 7.2 9.2 7.1 6.3  NEUTROABS  --   --  4.5 3.4  HGB 4.3* 7.1* 8.0* 8.3*  HCT 17.4* 24.5* 27.0* 27.9*  MCV 60.8* 66.6* 68.0* 67.9*  PLT 410* 356 354 346   Basic Metabolic Panel: Recent Labs  Lab 09/01/23 2213 09/03/23 0545  NA 138 139  K 3.1* 3.5  CL 107 110  CO2 24 23  GLUCOSE 110* 89  BUN 10 9  CREATININE 0.56 0.54  CALCIUM  8.3* 8.2*   Liver Function Tests: Recent Labs  Lab 09/01/23 2213  AST 14*  ALT 8   ALKPHOS 50  BILITOT 0.3  PROT 6.9  ALBUMIN 3.3*   CBG: No results for input(s): GLUCAP in the last 168 hours.  Discharge time spent:  35 minutes.  Signed: Drue ONEIDA Potter, MD [facility] 09/03/2023

## 2023-09-03 NOTE — Plan of Care (Signed)

## 2023-09-03 NOTE — Progress Notes (Signed)
 2 copies of patient's return to work note given to the pt; pt discharged via wheelchair to the [facility]

## 2023-09-03 NOTE — Plan of Care (Signed)

## 2023-09-03 NOTE — Progress Notes (Signed)
 MD order received in [facility] to discharge pt home today; verbally reviewed AVS with pt, Rxs escribed to [facility] in [place]; no questions voiced at this time; pt's discharge pending arrival of her ride at the [facility] entrance; verbally instructed pt that when her ride is here to call to advise staff in order to discharge her via wheelchair to the [facility] entrance

## 2023-10-08 ENCOUNTER — Telehealth: Payer: Self-pay | Admitting: Physician Assistant

## 2023-10-08 DIAGNOSIS — N938 Other specified abnormal uterine and vaginal bleeding: Secondary | ICD-10-CM

## 2023-10-09 NOTE — Progress Notes (Signed)
 Because of this being a recurrent issue of abnormal uterine bleeding,  I feel your condition warrants further evaluation and I recommend that you be seen in a face to face visit with your gynecologist or at one of our [facility] clinics.   NOTE: There will be NO CHARGE for this eVisit   If you are having a true medical emergency please call 911.    *[facility] for [facility] at [facility]             [address], [place], [place] [address] [phone] (*Take patients with no insurance)  *Center for [employer] at [facility] [address] [place], [place],  [place]  [address] [phone] (*Take patients with no insurance)  Center for [employer] at [facility]                                                             [address], [address], [place], [place], [address] [phone]  Center for [facility] at [facility] [address], [place], [place], [address] [phone]  Center for [facility] at [facility] [address], [place], [place], [address] [phone]  Center for [facility] at [facility]                                 [address] [place], [place], [place], [address] [phone]  Center for [facility] at [facility]                                    [address], [place], [place], [address] [phone]  Center for [facility] at [facility] [address], [place], [place], [address]                              [facility] [place] [address], [place], [place], [address] [phone]  Your MyChart E-visit questionnaire answers were reviewed by a board certified advanced clinical practitioner to complete your personal care plan based on your specific symptoms.  Thank you for using e-Visits.     I have spent 5 minutes in review of e-visit questionnaire, review and updating patient  chart, medical decision making and response to patient.   Delon CHRISTELLA Dickinson, PA-C

## 2024-04-05 ENCOUNTER — Ambulatory Visit
Admission: EM | Admit: 2024-04-05 | Discharge: 2024-04-05 | Disposition: A | Discharge status: Home / Self Care | Payer: Self-pay | Attending: Emergency Medicine | Admitting: Emergency Medicine

## 2024-04-05 DIAGNOSIS — A059 Bacterial foodborne intoxication, unspecified: Secondary | ICD-10-CM

## 2024-04-05 DIAGNOSIS — K047 Periapical abscess without sinus: Secondary | ICD-10-CM

## 2024-04-05 MED ORDER — CLINDAMYCIN HCL 300 MG PO CAPS: 300.0000 mg | ORAL_CAPSULE | Freq: Three times a day (TID) | ORAL | 0 refills | Status: AC

## 2024-04-05 MED ORDER — DICYCLOMINE HCL 20 MG PO TABS: 20.0000 mg | ORAL_TABLET | Freq: Two times a day (BID) | ORAL | 0 refills | Status: AC

## 2024-04-05 MED ORDER — ONDANSETRON 8 MG PO TBDP: 8.0000 mg | ORAL_TABLET | Freq: Three times a day (TID) | ORAL | 0 refills | Status: AC | PRN

## 2024-04-05 NOTE — Discharge Instructions (Addendum)
 I will discharge you home on clindamycin  3 times a day with food for 7 days for treatment of your dental infection.  I highly encourage you to take a probiotic 1 hour after each dose of antibiotic to help prevent worsening your diarrhea, and possibly improve it from your food poisoning.  Asked the pharmacist for suggestions.  You may use over-the-counter Tylenol  and/or ibuprofen according to the package directions as needed for any fever or pain.  Make an appointment with [facility] to discuss your dental pain.  I suspect that is most likely coming from the chip that you have in the front of your tooth.    Take the Zofran  every 8 hours as needed for nausea and vomiting.  They are an oral disintegrating tablet and you can place them on her under your tongue and then will be absorbed.  Use the Bentyl  (dicyclomine ) every 6 hours as needed for abdominal cramping.  Follow a clear liquid diet for the next 6 to 12 hours.  Clear liquids consist of broth, ginger ale, water, Pedialyte, and Jell-O.  After 6 to 12 hours, if you are tolerating clear liquids, you can advance to bland foods such as bananas, rice, applesauce, and toast.  If you tolerate bland foods you can continue to advance your diet as you see fit.  If you develop a fever over 100.5, increased abdominal pain, bloody vomit, or bloody stool return for reevaluation or go to the ER.

## 2024-04-05 NOTE — ED Provider Notes (Addendum)
 " [facility]    CSN: [id number] Arrival date & time: 04/05/24  0941      History   Chief Complaint Chief Complaint  Patient presents with   Dental Pain   Emesis    HPI May Manrique is a 49 y.o. female.   HPI  49 year old female with past medical history significant for dysmenorrhea and anemia presents for evaluation of dental and GI complaints.  She reports that she is having pain in her left frontal incisor that started yesterday.  No drainage or fever.  She is also been experiencing intermittent nausea, vomiting, diarrhea since eating [employer] on Tuesday.  She is able to drink fluids and keep them down.  She has not noticed any blood in her vomit or stool.  She has abdominal cramping prior to having diarrhea or vomiting but no abdominal pain at baseline.  Past Medical History:  Diagnosis Date   Anemia     Patient Active Problem List   Diagnosis Date Noted   Symptomatic anemia 09/02/2023   Dysfunctional uterine bleeding 09/02/2023   Pain, dental 03/23/2023   Dental infection 03/23/2023    Past Surgical History:  Procedure Laterality Date   BREAST BIOPSY Bilateral 12/13/2022   coil clip   BREAST BIOPSY Left 12/13/2022   US  LT BREAST BX W LOC DEV 1ST LESION IMG BX SPEC US  GUIDE 12/13/2022 [facility]    OB History   No obstetric history on file.      Home Medications    Prior to Admission medications  Medication Sig Start Date End Date Taking? Authorizing Provider  clindamycin  (CLEOCIN ) 300 MG capsule Take 1 capsule (300 mg total) by mouth 3 (three) times daily for 7 days. 04/05/24 04/12/24 Yes Bernardino Ditch, NP  dicyclomine (BENTYL) 20 MG tablet Take 1 tablet (20 mg total) by mouth 2 (two) times daily. 04/05/24  Yes Bernardino Ditch, NP  ferrous sulfate  (FEROSUL) 325 (65 FE) MG tablet Take 1 tablet (325 mg total) by mouth daily with breakfast. 09/03/23  Yes Djan, Drue DASEN, MD  medroxyPROGESTERone  (PROVERA ) 10 MG tablet Take 2 tablets (20 mg total) by  mouth 3 (three) times daily for 7 days. 09/03/23 04/05/24 Yes Dorinda Drue DASEN, MD  ondansetron  (ZOFRAN -ODT) 8 MG disintegrating tablet Take 1 tablet (8 mg total) by mouth every 8 (eight) hours as needed for nausea or vomiting. 04/05/24  Yes Bernardino Ditch, NP    Family History Family History  Problem Relation Age of Onset   Breast cancer Neg Hx     Social History Social History[1]   Allergies   Penicillins   Review of Systems Review of Systems  Constitutional:  Negative for fever.  HENT:  Positive for dental problem.   Gastrointestinal:  Positive for abdominal pain, diarrhea, nausea and vomiting. Negative for blood in stool.     Physical Exam Triage Vital Signs ED Triage Vitals  Encounter Vitals Group     BP 04/05/24 1010 (!) 140/86     Girls Systolic BP Percentile --      Girls Diastolic BP Percentile --      Boys Systolic BP Percentile --      Boys Diastolic BP Percentile --      Pulse Rate 04/05/24 1010 93     Resp --      Temp 04/05/24 1010 98.4 F (36.9 C)     Temp Source 04/05/24 1010 Oral     SpO2 04/05/24 1010 98 %     Weight --  Height --      Head Circumference --      Peak Flow --      Pain Score 04/05/24 1008 3     Pain Loc --      Pain Education --      Exclude from Growth Chart --    No data found.  Updated Vital Signs BP (!) 140/86 (BP Location: Left Arm)   Pulse 93   Temp 98.4 F (36.9 C) (Oral)   LMP 03/27/2024 (Approximate)   SpO2 98%   Visual Acuity Right Eye Distance:   Left Eye Distance:   Bilateral Distance:    Right Eye Near:   Left Eye Near:    Bilateral Near:     Physical Exam Vitals and nursing note reviewed.  Constitutional:      Appearance: Normal appearance. She is not ill-appearing.  HENT:     Head: Normocephalic and atraumatic.     Mouth/Throat:     Mouth: Mucous membranes are moist.     Pharynx: Oropharynx is clear. Posterior oropharyngeal erythema present. No oropharyngeal exudate.     Comments: The left  frontal incisor is chipped.  There is no surrounding erythema or edema of the gumline.  No exudate noted. Abdominal:     General: Abdomen is flat.     Palpations: Abdomen is soft.     Tenderness: There is no abdominal tenderness.  Skin:    General: Skin is warm and dry.     Capillary Refill: Capillary refill takes less than 2 seconds.  Neurological:     General: No focal deficit present.     Mental Status: She is alert and oriented to person, place, and time.      UC Treatments / Results  Labs (all labs ordered are listed, but only abnormal results are displayed) Labs Reviewed - No data to display  EKG   Radiology No results found.  Procedures Procedures (including critical care time)  Medications Ordered in UC Medications - No data to display  Initial Impression / Assessment and Plan / UC Course  I have reviewed the triage vital signs and the nursing notes.  Pertinent labs & imaging results that were available during my care of the patient were reviewed by me and considered in my medical decision making (see chart for details).   Patient is a nontoxic-appearing 49 year old female presenting for evaluation of dental pain and food poisoning.  The dental pain is most likely related to a chipped tooth in her left frontal incisor.  She reports the chip is been there for quite some time but she has never had pain in the tooth before.  She denies any recent injuries.  She has seen [facility] in the past and I encouraged her to make appointment to follow-up with them.  With regards to the vomiting and diarrhea.  It is associated with intermittent cramping and began after she ate at [facility].  Her vomiting is intermittent as is her diarrhea.  She is able to drink fluids and keep them down.  I advised her that food poisoning is typically self-limited.  I will discharge her home with some Zofran  and Bentyl to help with the nausea and cramping.  She should continue to orally rehydrate and  reintroduce foods as tolerated.  If she develops any increase in abdominal pain, vomiting where she cannot keep down fluids, or blood in her stool she needs to go to the ER for evaluation.  With regards to the dental  pain and concern she has an apical abscess.  She has a high allergy to penicillin so therefore I will discharge her home on clindamycin  300 mg 3 times daily x 7 days.   Final Clinical Impressions(s) / UC Diagnoses   Final diagnoses:  Dental infection  Food poisoning     Discharge Instructions      I will discharge you home on clindamycin  3 times a day with food for 7 days for treatment of your dental infection.  I highly encourage you to take a probiotic 1 hour after each dose of antibiotic to help prevent worsening your diarrhea, and possibly improve it from your food poisoning.  Asked the pharmacist for suggestions.  You may use over-the-counter Tylenol  and/or ibuprofen according to the package directions as needed for any fever or pain.  Make an appointment with [facility] to discuss your dental pain.  I suspect that is most likely coming from the chip that you have in the front of your tooth.    Take the Zofran  every 8 hours as needed for nausea and vomiting.  They are an oral disintegrating tablet and you can place them on her under your tongue and then will be absorbed.  Use the Bentyl (dicyclomine) every 6 hours as needed for abdominal cramping.  Follow a clear liquid diet for the next 6 to 12 hours.  Clear liquids consist of broth, ginger ale, water, Pedialyte, and Jell-O.  After 6 to 12 hours, if you are tolerating clear liquids, you can advance to bland foods such as bananas, rice, applesauce, and toast.  If you tolerate bland foods you can continue to advance your diet as you see fit.  If you develop a fever over 100.5, increased abdominal pain, bloody vomit, or bloody stool return for reevaluation or go to the ER.      ED Prescriptions     Medication  Sig Dispense Auth. Provider   clindamycin  (CLEOCIN ) 300 MG capsule Take 1 capsule (300 mg total) by mouth 3 (three) times daily for 7 days. 21 capsule Bernardino Ditch, NP   ondansetron  (ZOFRAN -ODT) 8 MG disintegrating tablet Take 1 tablet (8 mg total) by mouth every 8 (eight) hours as needed for nausea or vomiting. 20 tablet Bernardino Ditch, NP   dicyclomine (BENTYL) 20 MG tablet Take 1 tablet (20 mg total) by mouth 2 (two) times daily. 20 tablet Bernardino Ditch, NP      PDMP not reviewed this encounter.    Bernardino Ditch, NP 04/05/24 1024     [1]  Social History Tobacco Use   Smoking status: Former   Smokeless tobacco: Never  Vaping Use   Vaping status: Never Used  Substance Use Topics   Alcohol use: No   Drug use: No     Bernardino Ditch, NP 04/05/24 1026  "

## 2024-04-05 NOTE — ED Triage Notes (Signed)
 Patient presenting with dental pain yesterday. Onset Wednesday with emesis and diarrhea after eating [employer]. No one with similar symptoms.   Patient has not taken any meds for her symptoms.

## 2024-04-06 ENCOUNTER — Ambulatory Visit: Payer: Self-pay
